# Patient Record
Sex: Male | Born: 1980 | Race: White | Hispanic: No | Marital: Married | Smoking: Light tobacco smoker
Health system: Southern US, Community
[De-identification: ages and names within clinical notes are randomized; demographics above are authoritative.]

## PROBLEM LIST (undated history)

## (undated) DIAGNOSIS — M2550 Pain in unspecified joint: Secondary | ICD-10-CM

## (undated) DIAGNOSIS — M549 Dorsalgia, unspecified: Secondary | ICD-10-CM

## (undated) DIAGNOSIS — D239 Other benign neoplasm of skin, unspecified: Secondary | ICD-10-CM

## (undated) DIAGNOSIS — G4730 Sleep apnea, unspecified: Secondary | ICD-10-CM

## (undated) HISTORY — DX: Other benign neoplasm of skin, unspecified: D23.9

## (undated) HISTORY — DX: Sleep apnea, unspecified: G47.30

## (undated) HISTORY — DX: Pain in unspecified joint: M25.50

## (undated) HISTORY — DX: Dorsalgia, unspecified: M54.9

---
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
## 2003-11-13 ENCOUNTER — Emergency Department (HOSPITAL_COMMUNITY): Admission: EM | Admit: 2003-11-13 | Discharge: 2003-11-14 | Payer: Self-pay | Admitting: Emergency Medicine

## 2003-11-17 ENCOUNTER — Ambulatory Visit (HOSPITAL_COMMUNITY): Admission: RE | Admit: 2003-11-17 | Discharge: 2003-11-17 | Payer: Self-pay | Admitting: Family Medicine

## 2014-04-30 ENCOUNTER — Other Ambulatory Visit: Payer: Self-pay | Admitting: Family Medicine

## 2014-04-30 ENCOUNTER — Ambulatory Visit
Admission: RE | Admit: 2014-04-30 | Discharge: 2014-04-30 | Disposition: A | Discharge status: Home / Self Care | Payer: 59 | Source: Ambulatory Visit | Attending: Family Medicine | Admitting: Family Medicine

## 2014-04-30 DIAGNOSIS — J189 Pneumonia, unspecified organism: Secondary | ICD-10-CM

## 2016-01-04 ENCOUNTER — Other Ambulatory Visit: Payer: Self-pay | Admitting: Family Medicine

## 2016-01-04 DIAGNOSIS — N5089 Other specified disorders of the male genital organs: Secondary | ICD-10-CM

## 2016-01-11 ENCOUNTER — Ambulatory Visit
Admission: RE | Admit: 2016-01-11 | Discharge: 2016-01-11 | Disposition: A | Discharge status: Home / Self Care | Payer: Self-pay | Source: Ambulatory Visit | Attending: Family Medicine | Admitting: Family Medicine

## 2016-01-11 ENCOUNTER — Ambulatory Visit
Admission: RE | Admit: 2016-01-11 | Discharge: 2016-01-11 | Disposition: A | Discharge status: Home / Self Care | Payer: BLUE CROSS/BLUE SHIELD | Source: Ambulatory Visit | Attending: Family Medicine | Admitting: Family Medicine

## 2016-01-11 DIAGNOSIS — N5089 Other specified disorders of the male genital organs: Secondary | ICD-10-CM

## 2018-04-19 DIAGNOSIS — Z0000 Encounter for general adult medical examination without abnormal findings: Secondary | ICD-10-CM | POA: Diagnosis not present

## 2018-04-19 DIAGNOSIS — Z13220 Encounter for screening for lipoid disorders: Secondary | ICD-10-CM | POA: Diagnosis not present

## 2018-04-19 DIAGNOSIS — Z23 Encounter for immunization: Secondary | ICD-10-CM | POA: Diagnosis not present

## 2018-04-26 ENCOUNTER — Ambulatory Visit (INDEPENDENT_AMBULATORY_CARE_PROVIDER_SITE_OTHER): Payer: BLUE CROSS/BLUE SHIELD

## 2018-04-26 ENCOUNTER — Ambulatory Visit: Payer: BLUE CROSS/BLUE SHIELD | Admitting: Podiatry

## 2018-04-26 ENCOUNTER — Encounter: Payer: Self-pay | Admitting: Podiatry

## 2018-04-26 DIAGNOSIS — M722 Plantar fascial fibromatosis: Secondary | ICD-10-CM

## 2018-04-26 MED ORDER — MELOXICAM 15 MG PO TABS: 15.0000 mg | ORAL_TABLET | Freq: Every day | ORAL | 2 refills | Status: AC

## 2018-04-26 NOTE — Patient Instructions (Signed)

## 2018-04-26 NOTE — Progress Notes (Signed)
   Subjective:    Patient ID: Stephen Jenkins, male    DOB: 01-05-1981, 37 y.o.   MRN: [id number]  HPI Old male presents the office today for concerns of bilateral heel pain which is been on for the last 2 years.  He gets pain when he first gets up and gets better throughout the day.  He has tried over-the-counter inserts without any significant improvement.  He denies any swelling, numbness or tingling.  The pain does not wake him up at night.  Described a throbbing sensation.  He has no other concerns today.   Review of Systems  All other systems reviewed and are negative.  History reviewed. No pertinent past medical history.  History reviewed. No pertinent surgical history.   Current Outpatient Medications:  .  meloxicam (MOBIC) 15 MG tablet, Take 1 tablet (15 mg total) by mouth daily., Disp: 30 tablet, Rfl: 2  No Known Allergies      Objective:   Physical Exam  General: AAO x3, NAD  Dermatological: Skin is warm, dry and supple bilateral. Nails x 10 are well manicured; remaining integument appears unremarkable at this time. There are no open sores, no preulcerative lesions, no rash or signs of infection present.  Vascular: Dorsalis Pedis artery and Posterior Tibial artery pedal pulses are 2/4 bilateral with immedate capillary fill time. Pedal hair growth present. No varicosities and no lower extremity edema present bilateral. There is no pain with calf compression, swelling, warmth, erythema.   Neruologic: Grossly intact via light touch bilateral. Protective threshold with Semmes Wienstein monofilament intact to all pedal sites bilateral.  Negative Tinel sign.  Musculoskeletal: There is mild tenderness to palpation along the plantar medial tubercle of the calcaneus at the insertion of plantar fascia on the left and right foot. There is no pain along the course of the plantar fascia within the arch of the foot. Plantar fascia appears to be intact. There is no pain with lateral  compression of the calcaneus or pain with vibratory sensation. There is no pain along the course or insertion of the achilles tendon. No other areas of tenderness to bilateral lower extremities. Muscular strength 5/5 in all groups tested bilateral.  Gait: Unassisted, Nonantalgic.      Assessment & Plan:  Bilateral chronic plantar fasciitis -Treatment options discussed including all alternatives, risks, and complications -Etiology of symptoms were discussed -X-rays were obtained and reviewed with the patient.  No definitive evidence of acute fracture or stress fracture identified today. -Offered steroid injections today. Prescribed mobic. Discussed side effects of the medication and directed to stop if any are to occur and call the office.  -Plantar fascial braces b/l.  -Discussed stretching, icing exercises daily.  Ultimately discussed shoe modifications and orthotics.  When check orthotic coverage for him he can follow-up with Liliane Channel. -Symptoms continue discussed other treatment options including EPAT, PT.   Trula Slade DPM

## 2018-05-01 DIAGNOSIS — H90A32 Mixed conductive and sensorineural hearing loss, unilateral, left ear with restricted hearing on the contralateral side: Secondary | ICD-10-CM | POA: Diagnosis not present

## 2018-05-01 DIAGNOSIS — H90A21 Sensorineural hearing loss, unilateral, right ear, with restricted hearing on the contralateral side: Secondary | ICD-10-CM | POA: Diagnosis not present

## 2018-05-03 IMAGING — US US SCROTUM
1 series · 14 of 25 positions shown · non-contrast
Comparison: None.

CLINICAL DATA: Left testicular mass on recent physical examination.

EXAM:
SCROTAL ULTRASOUND
DOPPLER ULTRASOUND OF THE TESTICLES
TECHNIQUE: Complete ultrasound examination of the testicles, epididymis, and
other scrotal structures was performed. Color and spectral Doppler
ultrasound were also utilized to evaluate blood flow to the
testicles.

[Series 1: us scrotum · 0.07mm/px · 14 of 52 slices shown]
[im 1/52]
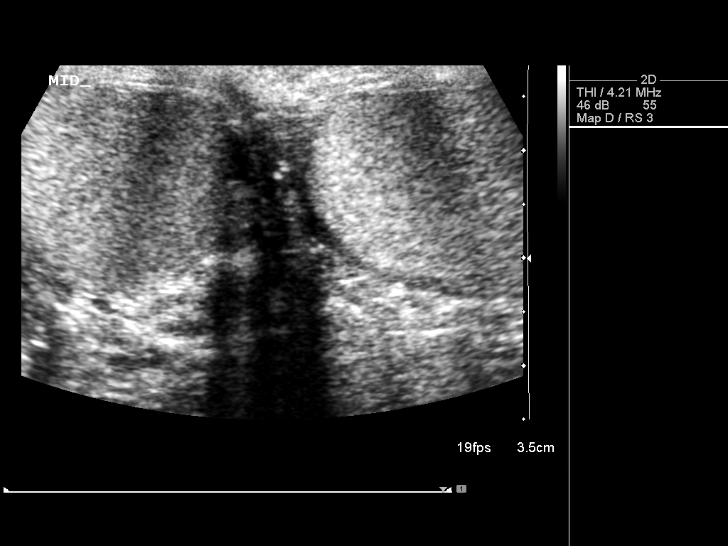
[im 5/52]
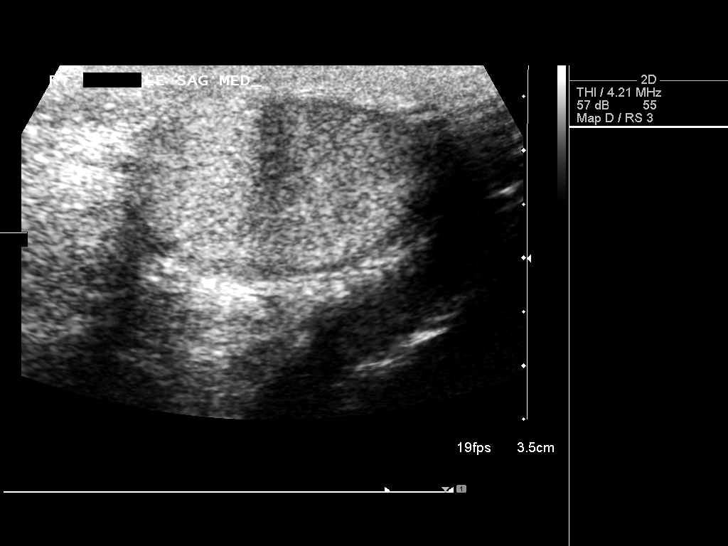
[im 9/52]
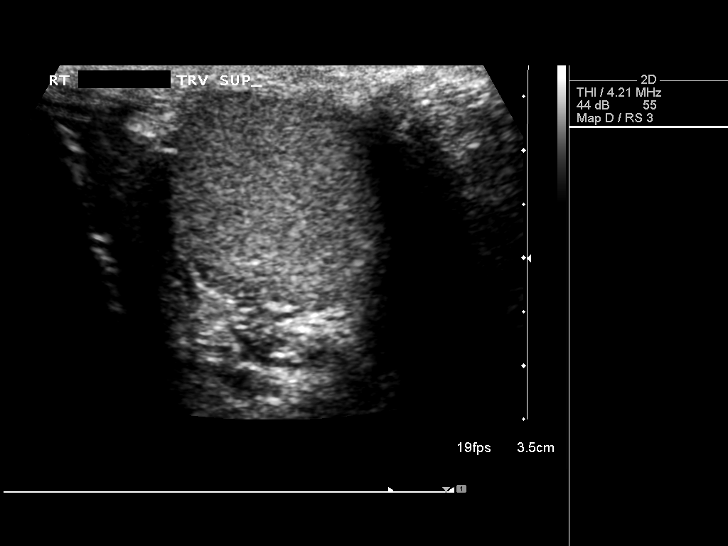
[im 13/52]
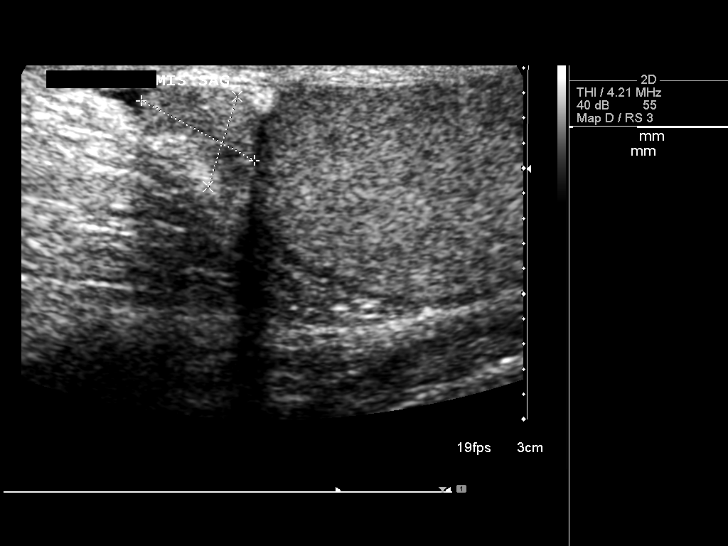
[im 18/52]
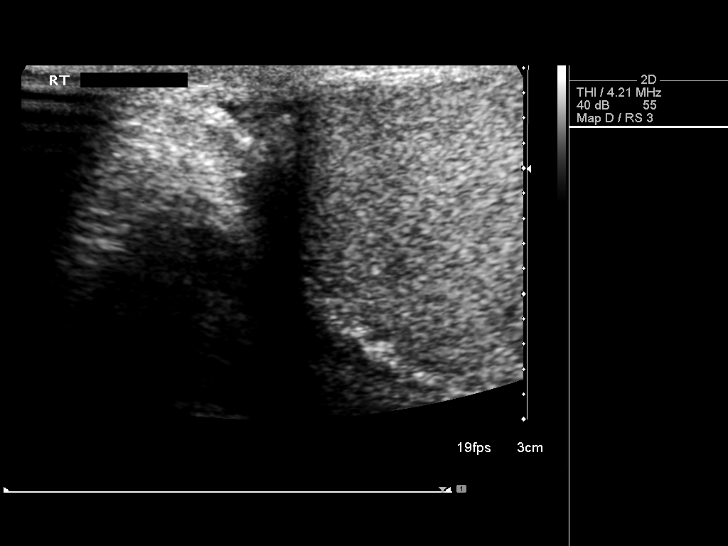
[im 20/52]
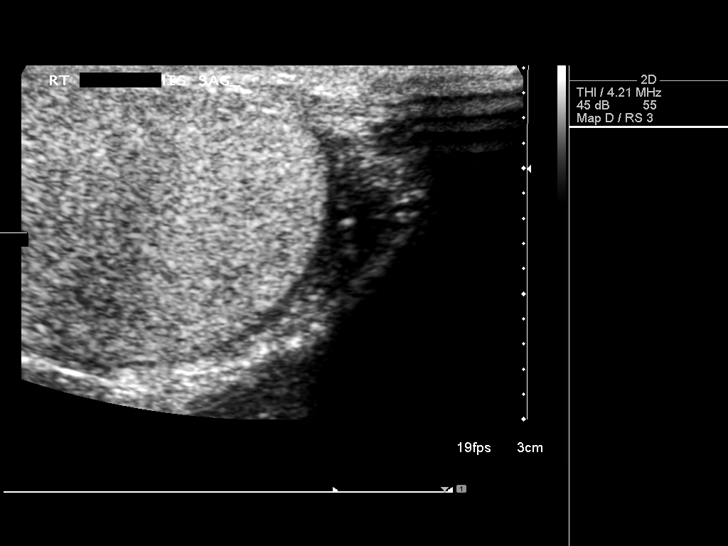
[im 24/52]
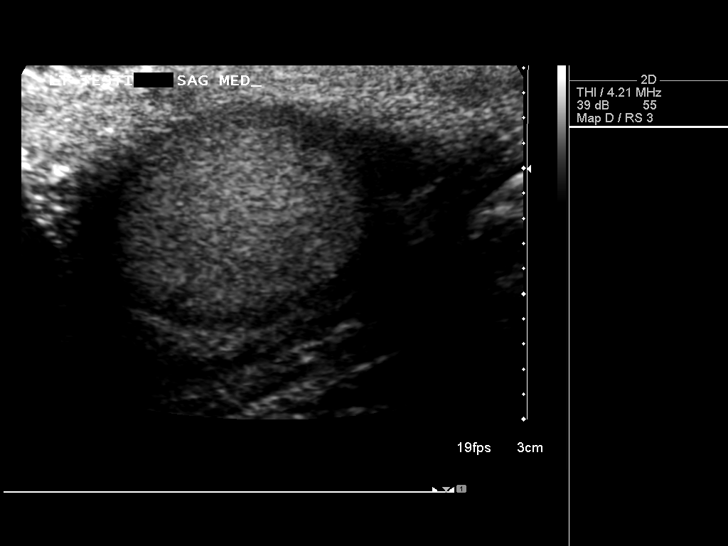
[im 28/52]
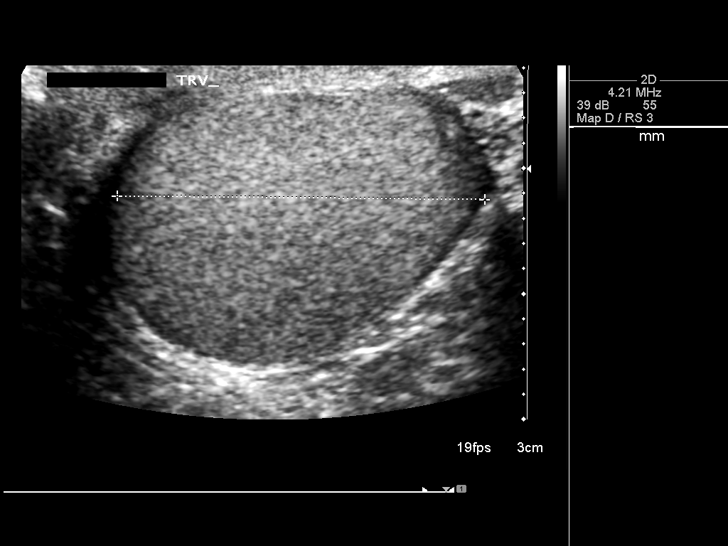
[im 32/52]
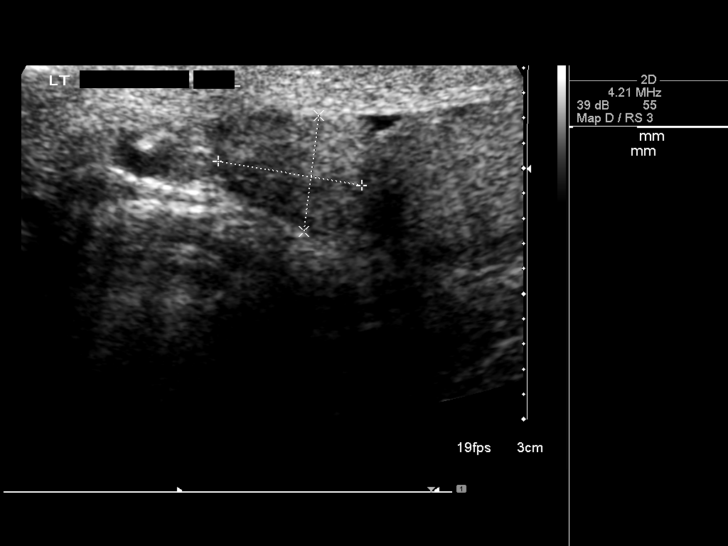
[im 35/52]
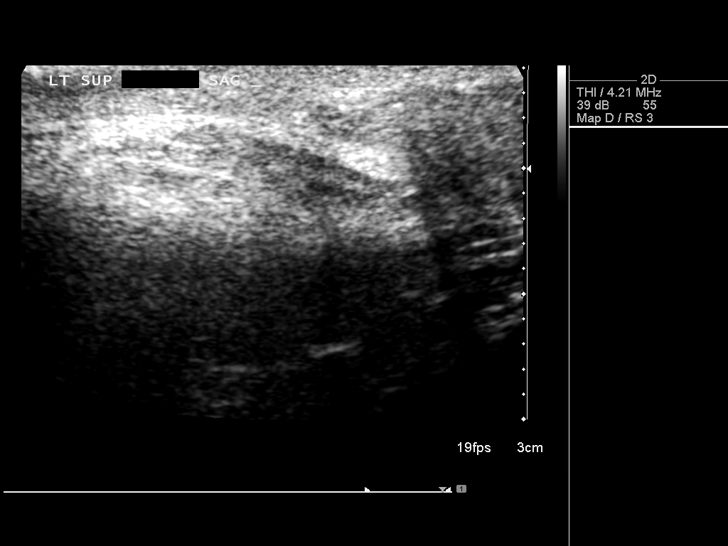
[im 39/52]
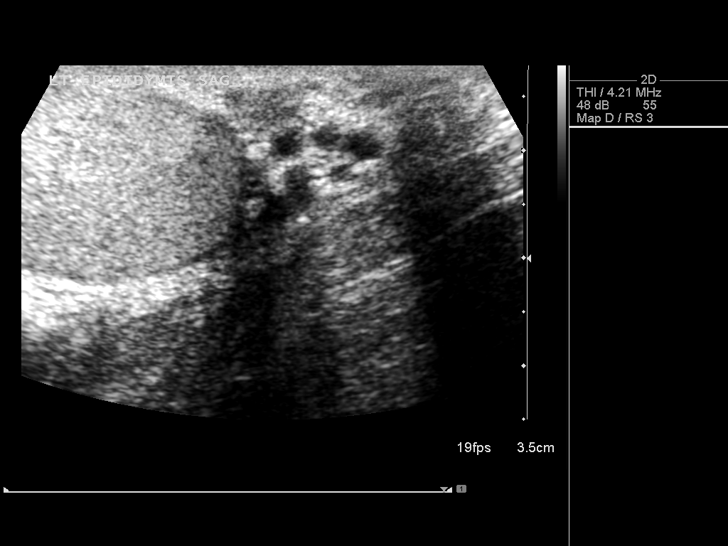
[im 43/52]
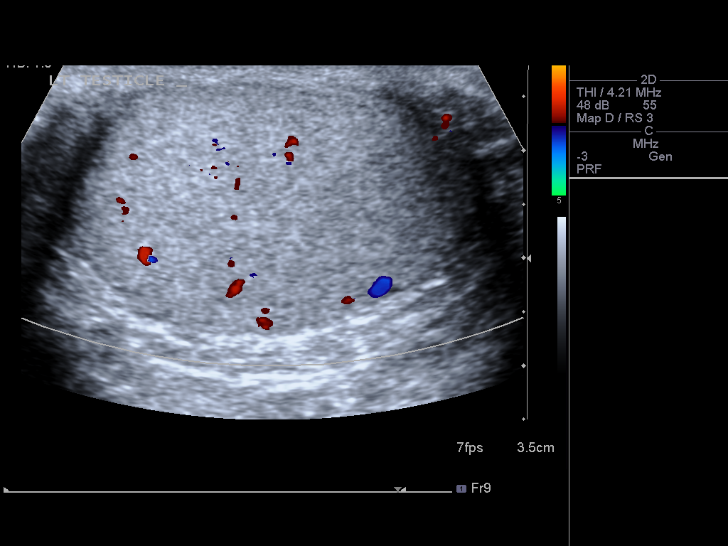
[im 47/52]
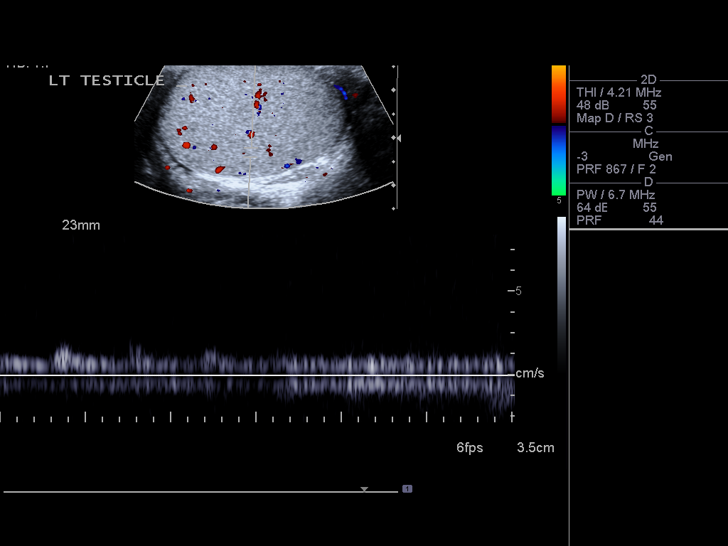
[im 52/52]
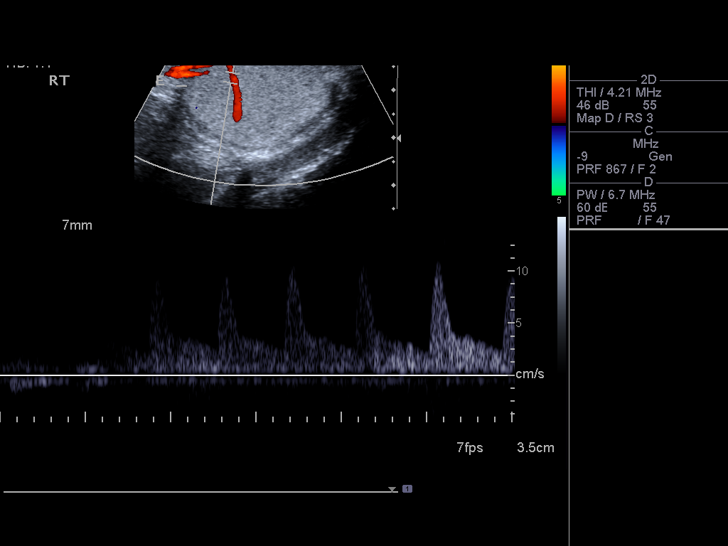

[14 of 25 positions shown; findings below may reference images not displayed]

FINDINGS: Right testicle

Measurements: 4.1 x 2.6 x 2.4 cm. No mass or microlithiasis
visualized.

Left testicle

Measurements: 3.8 x 2.9 x 2.2 cm. No mass or microlithiasis
visualized.

Right epididymis:  Normal in size and appearance.

Left epididymis:  Normal in size and appearance.

Hydrocele:  None visualized.

Varicocele:  None visualized.

Pulsed Doppler interrogation of both testes demonstrates normal low
resistance arterial and venous waveforms bilaterally.
IMPRESSION: Normal examination.  No mass seen.

## 2019-06-18 DIAGNOSIS — F320 Major depressive disorder, single episode, mild: Secondary | ICD-10-CM | POA: Diagnosis not present

## 2019-06-18 DIAGNOSIS — R079 Chest pain, unspecified: Secondary | ICD-10-CM | POA: Diagnosis not present

## 2019-06-18 DIAGNOSIS — Z0000 Encounter for general adult medical examination without abnormal findings: Secondary | ICD-10-CM | POA: Diagnosis not present

## 2019-06-18 DIAGNOSIS — D239 Other benign neoplasm of skin, unspecified: Secondary | ICD-10-CM | POA: Diagnosis not present

## 2019-06-18 DIAGNOSIS — Z13220 Encounter for screening for lipoid disorders: Secondary | ICD-10-CM | POA: Diagnosis not present

## 2019-06-19 ENCOUNTER — Ambulatory Visit: Payer: BC Managed Care – PPO | Admitting: Cardiology

## 2019-06-19 ENCOUNTER — Encounter: Payer: Self-pay | Admitting: Cardiology

## 2019-06-19 ENCOUNTER — Other Ambulatory Visit: Payer: Self-pay

## 2019-06-19 VITALS — BP 113/80 | HR 64 | Temp 97.2°F | Ht 68.0 in | Wt 260.0 lb

## 2019-06-19 DIAGNOSIS — Z7189 Other specified counseling: Secondary | ICD-10-CM | POA: Diagnosis not present

## 2019-06-19 DIAGNOSIS — R072 Precordial pain: Secondary | ICD-10-CM | POA: Insufficient documentation

## 2019-06-19 DIAGNOSIS — Z8249 Family history of ischemic heart disease and other diseases of the circulatory system: Secondary | ICD-10-CM | POA: Diagnosis not present

## 2019-06-19 DIAGNOSIS — Z716 Tobacco abuse counseling: Secondary | ICD-10-CM

## 2019-06-19 DIAGNOSIS — Z823 Family history of stroke: Secondary | ICD-10-CM

## 2019-06-19 DIAGNOSIS — Z6839 Body mass index (BMI) 39.0-39.9, adult: Secondary | ICD-10-CM

## 2019-06-19 NOTE — Patient Instructions (Signed)
Medication Instructions:  Your Physician recommend you continue on your current medication as directed.    *If you need a refill on your cardiac medications before your next appointment, please call your pharmacy*  Lab Work: None  Testing/Procedures: None  Follow-Up: At [facility], you and your health needs are our priority.  As part of our continuing mission to provide you with exceptional heart care, we have created designated Provider Care Teams.  These Care Teams include your primary Cardiologist (physician) and Advanced Practice Providers (APPs -  Physician Assistants and Nurse Practitioners) who all work together to provide you with the care you need, when you need it.  Your next appointment:   As needed  The format for your next appointment:   Either In Person or Virtual  Provider:   Buford Dresser, MD

## 2019-06-19 NOTE — Progress Notes (Signed)
Cardiology Office Note:    Date:  06/19/2019   ID:  AVITAJ SCHOLLER, DOB 07-12-1980, MRN [id number]  PCP:  Caren Macadam, MD  Cardiologist:  Buford Dresser, MD  Referring MD: Caren Macadam, MD   CC: new patient consultation for the evaluation and management of chest pain  History of Present Illness:    Stephen Jenkins is a 39 y.o. male with a family history of heart disease who is seen as a new consult at the request of Caren Macadam, MD for the evaluation and management of chest pain.  Notes received and reviewed from Dr. Roma Kayser office, date of office visit 06/18/19. At that time, he reported chest pain of many years duration, precipitated by exercise/exertion. Sharp, left anterior chest, lasts 15-30 seconds. Recently worsened. Goes away with rest.  Chest pain: -Initial onset: 10-15 years, unclear what started it, initial event was while relaxing. Very fleeting, tolerable initially, felt like "a muscle twinge but a little different, felt deeper". Would come off and on, sometimes with exercise, sometimes with changing position. More recently has been more intense and more frequent. -Quality: sharp, like a knife. Always in the same place, never radiates. -Frequency: at least once a day currently -Duration: 10-15 seconds -Associated symptoms: no other symptoms -Aggravating/alleviating factors: none that he can tell -Prior cardiac history: none that he knows of -Prior workup: none -Prior treatment: none -Alcohol: less than 2/day. Sometimes goes 6 mos without drinking, sometime will drink a case in a day. -Tobacco: rare, about 2 cigars/year. Does not feel he is a smoker. -Comorbidities: none that he knows of. BMI 39. -Exercise level: less intentional walking/exercise now, has been more sedentary with Covid. Can climb stairs, etc. -Cardiac ROS: no shortness of breath, no PND, no orthopnea, no LE edema, no syncope -Family history: PGF, had heart issues and stroke. Father had MI  last year, 95% blockage on main artery, had stent but had stroke as complication (age 19), has afib. Mother's side were all smokers, had issues with lungs.  Recently ordered sertraline, has not started yet.  Past Medical History:  Diagnosis Date  . Dysplastic nevi     History reviewed. No pertinent surgical history.  Current Medications: No current outpatient medications on file prior to visit.   No current facility-administered medications on file prior to visit.     Allergies:   Patient has no known allergies.   Social History   Tobacco Use  . Smoking status: Light Tobacco Smoker    Types: Cigars  . Smokeless tobacco: Never Used  Substance Use Topics  . Alcohol use: Yes  . Drug use: Not on file    Family History: family history includes CAD in his father; Heart disease in his paternal grandfather; Stroke in his father and paternal grandfather.  ROS:   Please see the history of present illness.  Additional pertinent ROS: Constitutional: Negative for chills, fever, night sweats, unintentional weight loss  HENT: Negative for ear pain and hearing loss.   Eyes: Negative for loss of vision and eye pain.  Respiratory: Negative for cough, sputum, wheezing.   Cardiovascular: See HPI. Gastrointestinal: Negative for abdominal pain, melena, and hematochezia.  Genitourinary: Negative for dysuria and hematuria.  Musculoskeletal: Negative for falls and myalgias.  Skin: Negative for itching and rash.  Neurological: Negative for focal weakness, focal sensory changes and loss of consciousness.  Endo/Heme/Allergies: Does not bruise/bleed easily.     EKGs/Labs/Other Studies Reviewed:    The following studies were reviewed today: No prior  cardiac studies  EKG:  EKG is personally reviewed.  The ekg ordered today demonstrates NSR  Recent Labs: No results found for requested labs within last 8760 hours.  Recent Lipid Panel No results found for: CHOL, TRIG, HDL, CHOLHDL, VLDL,  LDLCALC, LDLDIRECT  Physical Exam:    VS:  BP 113/80   Pulse 64   Temp (!) 97.2 F (36.2 C)   Ht 5\' 8"  (1.727 m)   Wt 260 lb (117.9 kg)   SpO2 98%   BMI 39.53 kg/m     Wt Readings from Last 3 Encounters:  06/19/19 260 lb (117.9 kg)    GEN: Well nourished, well developed in no acute distress HEENT: Normal, moist mucous membranes NECK: No JVD CARDIAC: regular rhythm, normal S1 and S2, no rubs or gallops. No murmurs. VASCULAR: Radial and DP pulses 2+ bilaterally. No carotid bruits RESPIRATORY:  Clear to auscultation without rales, wheezing or rhonchi  ABDOMEN: Soft, non-tender, non-distended MUSCULOSKELETAL:  Ambulates independently SKIN: Warm and dry, no edema NEUROLOGIC:  Alert and oriented x 3. No focal neuro deficits noted. PSYCHIATRIC:  Normal affect    ASSESSMENT:    1. Precordial pain   2. Family history of heart disease   3. Cardiac risk counseling   4. Counseling on health promotion and disease prevention   5. Tobacco abuse counseling    PLAN:    Chest pain: Symptoms are atypical in terms of quality, onset, location. We discussed cardiovascular risk at length today extensively, see below. He is low risk based on available information. -We spent significant time today reviewing different parts of the cardiovascular system (electrical, vascular, functional, and valvular). We discussed how each of these systems can present with different symptoms. We reviewed that there are different ways we evaluate these symptoms with tests. We reviewed which tests I think are most appropriate given the symptoms, and we discussed risks/benefits and limitations of each of these tests. Please see summary below. We also discussed that if testing is unrevealing for a cardiac cause of the symptoms, there are many noncardiac causes as well that can contribute to symptoms. If the heart is ruled out, then I recommend returning to PCP to discuss alternative diagnoses. -discussed treadmill stress,  nuclear stress/lexiscan, and CT coronary angiography. Discussed pros and cons of each, including but not limited to false positive/false negative risk, radiation risk, and risk of IV contrast dye. Based on shared decision making, decision was made to monitor symptoms and forgo additional testing at this time -counseled on red flag warning signs that need immediate medical attention  Tobacco abuse, tobacco cessation counseling: Rare, but cigar use counts. Recommend complete cessation. The patient was counseled on tobacco cessation today for 4 minutes.  Counseling included reviewing the risks of smoking tobacco products, how it impacts the patient's current medical diagnoses and different strategies for quitting.  Pharmacotherapy to aid in tobacco cessation was not prescribed today.  Family history of heart disease: father had MI in his mid-59s, which is not technically premature CAD but does count as family history. Discussed good lifestyle habits for prevention.  Cardiac risk counseling and prevention recommendations: -recommend heart healthy/Mediterranean diet, with whole grains, fruits, vegetable, fish, lean meats, nuts, and olive oil. Limit salt. -recommend moderate walking, 3-5 times/week for 30-50 minutes each session. Aim for at least 150 minutes.week. Goal should be pace of 3 miles/hours, or walking 1.5 miles in 30 minutes -recommend avoidance of tobacco products. Avoid excess alcohol. -Additional risk factor control:  -Diabetes risk: A1c  is not available  -Lipids: 06/18/19 Tchol 169, HDL 43, LDL 108, TG 91  -Blood pressure control: at goal, no history  -Weight: BMI 39, but athletic build. -ASCVD risk score: The ASCVD Risk score Mikey Bussing DC Jr., et al., 2013) failed to calculate for the following reasons:   The 2013 ASCVD risk score is only valid for ages 86 to 75   Assuming age 28, ASCVD 2.6%  Plan for follow up: he was encouraged to follow up as needed with any future concerns  Total time  of encounter: 45 minutes total time of encounter, including 33 minutes spent in face-to-face patient care. This time includes coordination of care and counseling regarding CV risk, chest pain, counseling. Remainder of non-face-to-face time involved reviewing chart documents/testing relevant to the patient encounter and documentation in the medical record.  Buford Dresser, MD, PhD [place]  [facility]    Medication Adjustments/Labs and Tests Ordered: Current medicines are reviewed at length with the patient today.  Concerns regarding medicines are outlined above.  Orders Placed This Encounter  Procedures  . EKG 12-Lead   No orders of the defined types were placed in this encounter.   Patient Instructions  Medication Instructions:  Your Physician recommend you continue on your current medication as directed.    *If you need a refill on your cardiac medications before your next appointment, please call your pharmacy*  Lab Work: None  Testing/Procedures: None  Follow-Up: At [facility], you and your health needs are our priority.  As part of our continuing mission to provide you with exceptional heart care, we have created designated Provider Care Teams.  These Care Teams include your primary Cardiologist (physician) and Advanced Practice Providers (APPs -  Physician Assistants and Nurse Practitioners) who all work together to provide you with the care you need, when you need it.  Your next appointment:   As needed  The format for your next appointment:   Either In Person or Virtual  Provider:   Buford Dresser, MD      Signed, Buford Dresser, MD PhD 06/19/2019  [place]

## 2019-07-19 DIAGNOSIS — R0683 Snoring: Secondary | ICD-10-CM | POA: Diagnosis not present

## 2019-07-19 DIAGNOSIS — Z6839 Body mass index (BMI) 39.0-39.9, adult: Secondary | ICD-10-CM | POA: Diagnosis not present

## 2019-07-19 DIAGNOSIS — R5383 Other fatigue: Secondary | ICD-10-CM | POA: Diagnosis not present

## 2019-07-19 DIAGNOSIS — F320 Major depressive disorder, single episode, mild: Secondary | ICD-10-CM | POA: Diagnosis not present

## 2019-07-19 DIAGNOSIS — R400 Somnolence: Secondary | ICD-10-CM | POA: Diagnosis not present

## 2019-08-09 DIAGNOSIS — Z86018 Personal history of other benign neoplasm: Secondary | ICD-10-CM | POA: Diagnosis not present

## 2019-08-09 DIAGNOSIS — L814 Other melanin hyperpigmentation: Secondary | ICD-10-CM | POA: Diagnosis not present

## 2019-08-09 DIAGNOSIS — D1801 Hemangioma of skin and subcutaneous tissue: Secondary | ICD-10-CM | POA: Diagnosis not present

## 2019-08-09 DIAGNOSIS — D225 Melanocytic nevi of trunk: Secondary | ICD-10-CM | POA: Diagnosis not present

## 2019-08-09 DIAGNOSIS — L905 Scar conditions and fibrosis of skin: Secondary | ICD-10-CM | POA: Diagnosis not present

## 2019-08-09 DIAGNOSIS — L578 Other skin changes due to chronic exposure to nonionizing radiation: Secondary | ICD-10-CM | POA: Diagnosis not present

## 2019-08-09 DIAGNOSIS — D485 Neoplasm of uncertain behavior of skin: Secondary | ICD-10-CM | POA: Diagnosis not present

## 2019-08-14 DIAGNOSIS — R0683 Snoring: Secondary | ICD-10-CM | POA: Diagnosis not present

## 2019-08-14 DIAGNOSIS — R0681 Apnea, not elsewhere classified: Secondary | ICD-10-CM | POA: Diagnosis not present

## 2019-08-14 DIAGNOSIS — G4700 Insomnia, unspecified: Secondary | ICD-10-CM | POA: Diagnosis not present

## 2019-08-14 DIAGNOSIS — G478 Other sleep disorders: Secondary | ICD-10-CM | POA: Diagnosis not present

## 2019-08-28 DIAGNOSIS — G4733 Obstructive sleep apnea (adult) (pediatric): Secondary | ICD-10-CM | POA: Diagnosis not present

## 2019-08-29 DIAGNOSIS — G4733 Obstructive sleep apnea (adult) (pediatric): Secondary | ICD-10-CM | POA: Diagnosis not present

## 2019-09-16 DIAGNOSIS — F320 Major depressive disorder, single episode, mild: Secondary | ICD-10-CM | POA: Diagnosis not present

## 2019-09-16 DIAGNOSIS — E669 Obesity, unspecified: Secondary | ICD-10-CM | POA: Diagnosis not present

## 2020-01-28 DIAGNOSIS — D225 Melanocytic nevi of trunk: Secondary | ICD-10-CM | POA: Diagnosis not present

## 2020-01-28 DIAGNOSIS — L905 Scar conditions and fibrosis of skin: Secondary | ICD-10-CM | POA: Diagnosis not present

## 2020-01-28 DIAGNOSIS — D485 Neoplasm of uncertain behavior of skin: Secondary | ICD-10-CM | POA: Diagnosis not present

## 2020-01-28 DIAGNOSIS — D2262 Melanocytic nevi of left upper limb, including shoulder: Secondary | ICD-10-CM | POA: Diagnosis not present

## 2020-03-17 DIAGNOSIS — R61 Generalized hyperhidrosis: Secondary | ICD-10-CM | POA: Diagnosis not present

## 2020-03-17 DIAGNOSIS — F320 Major depressive disorder, single episode, mild: Secondary | ICD-10-CM | POA: Diagnosis not present

## 2020-04-24 DIAGNOSIS — Z20822 Contact with and (suspected) exposure to covid-19: Secondary | ICD-10-CM | POA: Diagnosis not present

## 2020-04-24 DIAGNOSIS — Z03818 Encounter for observation for suspected exposure to other biological agents ruled out: Secondary | ICD-10-CM | POA: Diagnosis not present

## 2020-06-25 DIAGNOSIS — G4733 Obstructive sleep apnea (adult) (pediatric): Secondary | ICD-10-CM | POA: Diagnosis not present

## 2020-06-25 DIAGNOSIS — Z0000 Encounter for general adult medical examination without abnormal findings: Secondary | ICD-10-CM | POA: Diagnosis not present

## 2020-06-25 DIAGNOSIS — Z13220 Encounter for screening for lipoid disorders: Secondary | ICD-10-CM | POA: Diagnosis not present

## 2020-06-25 DIAGNOSIS — R251 Tremor, unspecified: Secondary | ICD-10-CM | POA: Diagnosis not present

## 2020-06-25 DIAGNOSIS — F320 Major depressive disorder, single episode, mild: Secondary | ICD-10-CM | POA: Diagnosis not present

## 2020-06-25 DIAGNOSIS — R030 Elevated blood-pressure reading, without diagnosis of hypertension: Secondary | ICD-10-CM | POA: Diagnosis not present

## 2020-06-26 ENCOUNTER — Encounter: Payer: Self-pay | Admitting: Neurology

## 2020-06-30 NOTE — Progress Notes (Unsigned)
Assessment/Plan:   1.  Essential Tremor.  -This is evidenced by the symmetrical nature and longstanding hx of gradually getting worse.  We discussed nature and pathophysiology.  We discussed that this can continue to gradually get worse with time.  We discussed that some medications can worsen this, as can caffeine use.  We discussed medication therapy as well as surgical therapy.  Ultimately, the patient decided to hold off on any medications.  He really just wanted to know that this was not something more progressive or detrimental to his health.  He will certainly let me know if tremor is more bothersome or requires medication/intervention.  He will follow up with me on an as-needed basis.   Subjective:   Stephen Jenkins was seen today in the movement disorders clinic for neurologic consultation at the request of Caren Macadam, MD.  The consultation is for the evaluation of tremor.  Tremor: Yes.     How long has it been going on? few years per patient, worse over last 2 months  At rest or with activation?  activation  When is it noted the most?  Doing physical things with the R hand, esp tedious activities  Fam hx of tremor?  Yes.  maternal aunt w tremor and great paternal uncle with tremor  Located where?  R hand>L hand.  No tremor in the legs  Affected by caffeine:  No. (drinks 24-36 oz of coffee/day)  Affected by alcohol:  No. (none in 3 months but then may binge)  Affected by stress:  Yes.    Affected by fatigue:  No.  Spills soup if on spoon:  No. (he is R hand dominant)  Spills glass of liquid if full:  No.  Affects ADL's (tying shoes, brushing teeth, etc):  No.  Tremor inducing meds:  No.  Other Specific Symptoms:  Voice: no change Sleep: sleeps well  Vivid Dreams:  No.  Acting out dreams:  No. Wet Pillows: No. Postural symptoms:  Mild trouble and attributes to meds  Falls?  No. Bradykinesia symptoms: no bradykinesia noted Loss of smell:  No. Loss of taste:   No. Urinary Incontinence:  No. Difficulty Swallowing:  No. Handwriting, micrographia: No. Trouble with ADL's:  No.  Trouble buttoning clothing: No. Depression:  Good with sertraline N/V:  No. Lightheaded:  No.  Syncope: No. Diplopia:  No. but some blurry vision Dyskinesia:  No.  Neuroimaging of the brain has not previously been performed.    ALLERGIES:  No Known Allergies  CURRENT MEDICATIONS:  Current Outpatient Medications  Medication Instructions  . sertraline (ZOLOFT) 50 MG tablet 1 tablet, Oral, Daily    Objective:   PHYSICAL EXAMINATION:    VITALS:   Vitals:   07/02/20 0945  BP: 120/88  Pulse: 68  SpO2: 98%  Weight: 242 lb (109.8 kg)  Height: 5\' 9"  (1.753 m)    GEN:  The patient appears stated age and is in NAD. HEENT:  Normocephalic, atraumatic.  The mucous membranes are moist. The superficial temporal arteries are without ropiness or tenderness. CV:  RRR Lungs:  CTAB Neck/HEME:  There are no carotid bruits bilaterally.  Neurological examination:  Orientation: The patient is alert and oriented x3.  Cranial nerves: There is good facial symmetry.  Extraocular muscles are intact. The visual fields are full to confrontational testing. The speech is fluent and clear. Soft palate rises symmetrically and there is no tongue deviation. Hearing is intact to conversational tone. Sensation: Sensation is intact to light touch  throughout (facial, trunk, extremities). Vibration is intact at the bilateral big toe. There is no extinction with double simultaneous stimulation.  Motor: Strength is 5/5 in the bilateral upper and lower extremities.   Shoulder shrug is equal and symmetric.  There is no pronator drift. Deep tendon reflexes: Deep tendon reflexes are 2/4 at the bilateral biceps, triceps, brachioradialis, patella and achilles. Plantar responses are downgoing bilaterally.  Movement examination: Tone: There is normal tone in the bilateral upper extremities.  The tone  in the lower extremities is normal.  Abnormal movements: No rest tremor.  Very little postural tremor.  Mild intention tremor, slightly worse with the weight.  Slightly worse on the right side than the left.  Able to draw Archimedes spirals without significant tremor.  Able to pour water from 1 glass to another. Coordination:  There is no decremation with RAM's, with any form of RAMS, including alternating supination and pronation of the forearm, hand opening and closing, finger taps, heel taps and toe taps. Gait and Station: The patient has no difficulty arising out of a deep-seated chair without the use of the hands. The patient's stride length is good.   I have reviewed and interpreted the following labs independently  Lab work was done by primary care on March 17, 2020.  TSH was 0.92.  Sodium was 140, potassium 5.1, chloride 104, BUN 17, creatinine 0.98, AST 13, ALT 17, white blood cells 5.8, hemoglobin 15.7, hematocrit 46.9 and platelets 231  Total time spent on today's visit was 40 minutes, including both face-to-face time and nonface-to-face time.  Time included that spent on review of records (prior notes available to me/labs/imaging if pertinent), discussing treatment and goals, answering patient's questions and coordinating care.  Cc:  Caren Macadam, MD

## 2020-07-02 ENCOUNTER — Other Ambulatory Visit: Payer: Self-pay

## 2020-07-02 ENCOUNTER — Encounter: Payer: Self-pay | Admitting: Neurology

## 2020-07-02 ENCOUNTER — Ambulatory Visit: Payer: BC Managed Care – PPO | Admitting: Neurology

## 2020-07-02 VITALS — BP 120/88 | HR 68 | Ht 69.0 in | Wt 242.0 lb

## 2020-07-02 DIAGNOSIS — G250 Essential tremor: Secondary | ICD-10-CM

## 2020-07-02 NOTE — Patient Instructions (Signed)

## 2020-08-04 DIAGNOSIS — L814 Other melanin hyperpigmentation: Secondary | ICD-10-CM | POA: Diagnosis not present

## 2020-08-04 DIAGNOSIS — D1801 Hemangioma of skin and subcutaneous tissue: Secondary | ICD-10-CM | POA: Diagnosis not present

## 2020-08-04 DIAGNOSIS — Z86018 Personal history of other benign neoplasm: Secondary | ICD-10-CM | POA: Diagnosis not present

## 2020-08-04 DIAGNOSIS — D225 Melanocytic nevi of trunk: Secondary | ICD-10-CM | POA: Diagnosis not present

## 2021-02-11 DIAGNOSIS — M722 Plantar fascial fibromatosis: Secondary | ICD-10-CM | POA: Diagnosis not present

## 2021-04-09 DIAGNOSIS — M79671 Pain in right foot: Secondary | ICD-10-CM | POA: Diagnosis not present

## 2021-04-09 DIAGNOSIS — M79672 Pain in left foot: Secondary | ICD-10-CM | POA: Diagnosis not present

## 2021-04-09 DIAGNOSIS — M722 Plantar fascial fibromatosis: Secondary | ICD-10-CM | POA: Diagnosis not present

## 2021-04-22 DIAGNOSIS — M79672 Pain in left foot: Secondary | ICD-10-CM | POA: Diagnosis not present

## 2021-04-22 DIAGNOSIS — M722 Plantar fascial fibromatosis: Secondary | ICD-10-CM | POA: Diagnosis not present

## 2021-04-22 DIAGNOSIS — M79671 Pain in right foot: Secondary | ICD-10-CM | POA: Diagnosis not present

## 2021-05-13 DIAGNOSIS — M79672 Pain in left foot: Secondary | ICD-10-CM | POA: Diagnosis not present

## 2021-05-13 DIAGNOSIS — M722 Plantar fascial fibromatosis: Secondary | ICD-10-CM | POA: Diagnosis not present

## 2021-05-13 DIAGNOSIS — M79671 Pain in right foot: Secondary | ICD-10-CM | POA: Diagnosis not present

## 2021-06-04 DIAGNOSIS — J069 Acute upper respiratory infection, unspecified: Secondary | ICD-10-CM | POA: Diagnosis not present

## 2021-06-04 DIAGNOSIS — R509 Fever, unspecified: Secondary | ICD-10-CM | POA: Diagnosis not present

## 2021-06-04 DIAGNOSIS — R051 Acute cough: Secondary | ICD-10-CM | POA: Diagnosis not present

## 2021-06-04 DIAGNOSIS — U071 COVID-19: Secondary | ICD-10-CM | POA: Diagnosis not present

## 2021-06-04 DIAGNOSIS — R059 Cough, unspecified: Secondary | ICD-10-CM | POA: Diagnosis not present

## 2021-06-04 DIAGNOSIS — R519 Headache, unspecified: Secondary | ICD-10-CM | POA: Diagnosis not present

## 2021-07-08 DIAGNOSIS — Z0000 Encounter for general adult medical examination without abnormal findings: Secondary | ICD-10-CM | POA: Diagnosis not present

## 2021-07-08 DIAGNOSIS — Z13220 Encounter for screening for lipoid disorders: Secondary | ICD-10-CM | POA: Diagnosis not present

## 2021-11-29 DIAGNOSIS — R5383 Other fatigue: Secondary | ICD-10-CM | POA: Diagnosis not present

## 2021-11-29 DIAGNOSIS — R0602 Shortness of breath: Secondary | ICD-10-CM | POA: Diagnosis not present

## 2021-11-29 DIAGNOSIS — Z1389 Encounter for screening for other disorder: Secondary | ICD-10-CM | POA: Diagnosis not present

## 2021-11-29 DIAGNOSIS — E669 Obesity, unspecified: Secondary | ICD-10-CM | POA: Diagnosis not present

## 2021-11-29 DIAGNOSIS — G4733 Obstructive sleep apnea (adult) (pediatric): Secondary | ICD-10-CM | POA: Diagnosis not present

## 2021-12-20 DIAGNOSIS — G4733 Obstructive sleep apnea (adult) (pediatric): Secondary | ICD-10-CM | POA: Diagnosis not present

## 2021-12-20 DIAGNOSIS — Z6836 Body mass index (BMI) 36.0-36.9, adult: Secondary | ICD-10-CM | POA: Diagnosis not present

## 2021-12-20 DIAGNOSIS — E669 Obesity, unspecified: Secondary | ICD-10-CM | POA: Diagnosis not present

## 2022-03-09 ENCOUNTER — Encounter (INDEPENDENT_AMBULATORY_CARE_PROVIDER_SITE_OTHER): Payer: BLUE CROSS/BLUE SHIELD | Admitting: Family Medicine

## 2022-03-10 DIAGNOSIS — Z0289 Encounter for other administrative examinations: Secondary | ICD-10-CM

## 2022-03-11 ENCOUNTER — Emergency Department (HOSPITAL_BASED_OUTPATIENT_CLINIC_OR_DEPARTMENT_OTHER): Payer: BC Managed Care – PPO

## 2022-03-11 ENCOUNTER — Emergency Department (HOSPITAL_BASED_OUTPATIENT_CLINIC_OR_DEPARTMENT_OTHER)
Admission: EM | Admit: 2022-03-11 | Discharge: 2022-03-11 | Disposition: A | Discharge status: Home / Self Care | Payer: BC Managed Care – PPO | Attending: Emergency Medicine | Admitting: Emergency Medicine

## 2022-03-11 ENCOUNTER — Encounter (HOSPITAL_BASED_OUTPATIENT_CLINIC_OR_DEPARTMENT_OTHER): Payer: Self-pay | Admitting: *Deleted

## 2022-03-11 ENCOUNTER — Other Ambulatory Visit: Payer: Self-pay

## 2022-03-11 DIAGNOSIS — R519 Headache, unspecified: Secondary | ICD-10-CM | POA: Insufficient documentation

## 2022-03-11 DIAGNOSIS — R42 Dizziness and giddiness: Secondary | ICD-10-CM | POA: Insufficient documentation

## 2022-03-11 DIAGNOSIS — R0789 Other chest pain: Secondary | ICD-10-CM | POA: Diagnosis not present

## 2022-03-11 DIAGNOSIS — R079 Chest pain, unspecified: Secondary | ICD-10-CM

## 2022-03-11 LAB — BASIC METABOLIC PANEL
Anion gap: 10 (ref 5–15)
BUN: 19 mg/dL (ref 6–20)
CO2: 23 mmol/L (ref 22–32)
Calcium: 8.9 mg/dL (ref 8.9–10.3)
Chloride: 106 mmol/L (ref 98–111)
Creatinine, Ser: 1.04 mg/dL (ref 0.61–1.24)
GFR, Estimated: >60 mL/min (ref >60)
Glucose, Bld: 94 mg/dL (ref 70–99)
Potassium: 4.6 mmol/L (ref 3.5–5.1)
Sodium: 139 mmol/L (ref 135–145)

## 2022-03-11 LAB — CBC
HCT: 45.2 % (ref 39.0–52.0)
Hemoglobin: 15.7 g/dL (ref 13.0–17.0)
MCH: 29.1 pg (ref 26.0–34.0)
MCHC: 34.7 g/dL (ref 30.0–36.0)
MCV: 83.9 fL (ref 80.0–100.0)
Platelets: 198 10*3/uL (ref 150–400)
RBC: 5.39 MIL/uL (ref 4.22–5.81)
RDW: 12.6 % (ref 11.5–15.5)
WBC: 5.5 10*3/uL (ref 4.0–10.5)
nRBC: 0 % (ref 0.0–0.2)

## 2022-03-11 LAB — TROPONIN I (HIGH SENSITIVITY): Troponin I (High Sensitivity): <2 ng/L (ref <18)

## 2022-03-11 NOTE — ED Provider Notes (Signed)
[facility] Provider Note   CSN: [id number] Arrival date & time: 03/11/22  0160     History  Chief Complaint  Patient presents with   Chest Pain   Dizziness   Headache    Stephen Jenkins is a 41 y.o. male.  Patient is a 41 year old male with no significant past medical history presenting to the emergency department with chest pain and headache.  Patient states that he developed a chest pressure type pain yesterday afternoon that persisted throughout the night.  He states that he tried taking Tums but had no relief.  He states he did have chicken Parmesan for dinner last night but his pain started prior to eating.  He states that he felt like it was difficult to take a deep breath.  He states that he has also been having frontal headaches on and off for the past month.  He states these are not a daily occurrence but do occur several times a week.  He states he mostly wakes up with a headache but it can come on anytime throughout the day.  He states he has associated blurry vision.  He denies any numbness or weakness, nausea or vomiting.  He denies any recent fevers, lower extremity swelling, cough or sore throat.  His wife does report that he has a history of anxiety and has been off his anxiety meds for the last year so she is unsure if this could be contributing to his symptoms.  Denies any recent hospitalizations or surgery, recent long travels in the car plane, cancer history, hormone use.  The history is provided by the patient and the spouse.  Chest Pain Associated symptoms: dizziness and headache   Dizziness Associated symptoms: chest pain and headaches   Headache Associated symptoms: dizziness        Home Medications Prior to Admission medications   Medication Sig Start Date End Date Taking? Authorizing Provider  sertraline (ZOLOFT) 50 MG tablet Take 1 tablet by mouth daily.    [provider]      Allergies    Patient has no known  allergies.    Review of Systems   Review of Systems  Cardiovascular:  Positive for chest pain.  Neurological:  Positive for dizziness and headaches.    Physical Exam Updated Vital Signs BP (!) 137/92   Pulse 68   Resp 18   Ht '5\' 9"'$  (1.753 m)   Wt 114.8 kg   SpO2 96%   BMI 37.36 kg/m  Physical Exam Vitals and nursing note reviewed.  Constitutional:      General: He is not in acute distress.    Appearance: He is well-developed.  HENT:     Head: Normocephalic and atraumatic.  Eyes:     Extraocular Movements: Extraocular movements intact.     Pupils: Pupils are equal, round, and reactive to light.  Cardiovascular:     Rate and Rhythm: Normal rate and regular rhythm.     Pulses:          Radial pulses are 2+ on the right side and 2+ on the left side.       Dorsalis pedis pulses are 2+ on the right side and 2+ on the left side.     Heart sounds: Normal heart sounds.  Pulmonary:     Effort: Pulmonary effort is normal.     Breath sounds: Normal breath sounds.  Chest:     Chest wall: No tenderness.  Abdominal:  Palpations: Abdomen is soft.     Tenderness: There is no abdominal tenderness.  Musculoskeletal:        General: Normal range of motion.     Cervical back: Normal range of motion and neck supple.     Right lower leg: No edema.     Left lower leg: No edema.  Skin:    General: Skin is warm and dry.  Neurological:     General: No focal deficit present.     Mental Status: He is alert and oriented to person, place, and time.     Cranial Nerves: No cranial nerve deficit.     Comments: Strength 5 out of 5 in all 4 extremities Normal finger-to-nose bilaterally Sensation intact in all 4 extremities  Psychiatric:        Mood and Affect: Mood normal.        Behavior: Behavior normal.     ED Results / Procedures / Treatments   Labs (all labs ordered are listed, but only abnormal results are displayed) Labs Reviewed  BASIC METABOLIC PANEL  CBC  TROPONIN I (HIGH  SENSITIVITY)    EKG EKG Interpretation  Date/Time:  Friday March 11 2022 06:43:09 EDT Ventricular Rate:  68 PR Interval:  168 QRS Duration: 92 QT Interval:  370 QTC Calculation: 394 R Axis:   54 Text Interpretation: Sinus rhythm No previous ECGs available Confirmed by Leanord Asal ([id number]) on 03/11/2022 7:08:03 AM  Radiology CT Head Wo Contrast  Result Date: 03/11/2022 CLINICAL DATA:  Headache EXAM: CT HEAD WITHOUT CONTRAST TECHNIQUE: Contiguous axial images were obtained from the base of the skull through the vertex without intravenous contrast. RADIATION DOSE REDUCTION: This exam was performed according to the departmental dose-optimization program which includes automated exposure control, adjustment of the mA and/or kV according to patient size and/or use of iterative reconstruction technique. COMPARISON:  None Available. FINDINGS: Brain: No evidence of acute infarction, hemorrhage, hydrocephalus, extra-axial collection or mass lesion/mass effect. Vascular: No hyperdense vessel or unexpected calcification. Skull: Normal. Negative for fracture or focal lesion. Sinuses/Orbits: No acute finding. Other: None. IMPRESSION: No etiology for headaches identified. Electronically Signed   By: Marin Roberts M.D.   On: 03/11/2022 07:50   DG Chest Portable 1 View  Result Date: 03/11/2022 CLINICAL DATA:  Chest pain EXAM: PORTABLE CHEST 1 VIEW COMPARISON:  04/30/2014 FINDINGS: Normal heart size and mediastinal contours. No acute infiltrate or edema. No effusion or pneumothorax. No acute osseous findings. IMPRESSION: Negative portable chest. Electronically Signed   By: Jorje Guild M.D.   On: 03/11/2022 07:11    Procedures Procedures    Medications Ordered in ED Medications - No data to display  ED Course/ Medical Decision Making/ A&P Clinical Course as of 03/11/22 0806  Fri Mar 11, 2022  0800 CT head was negative, labs are within normal range including a negative troponin.  His chest  pain has been ongoing for several hours so single troponin is sufficient.  Patient is stable for discharge home with primary care follow-up and will be given strict return precautions. [VK]    Clinical Course User Index [VK] Kemper Durie, DO                           Medical Decision Making This patient presents to the ED with chief complaint(s) of chest pain, headache with no pertinent past medical history which further complicates the presenting complaint. The complaint involves an extensive differential diagnosis and  also carries with it a high risk of complications and morbidity.    The differential diagnosis includes ACS, arrhythmia, anemia, electrolyte abnormality, pneumonia, pneumothorax, PE unlikely as patient is PERC negative, possible mass effect as cause of headache, no focal neurologic deficits and due to the chronicity of the headache, CVA or ICH is unlikely, he has no fevers or other infectious symptoms making viral syndrome less likely  Additional history obtained: Additional history obtained from spouse Records reviewed outpatient cardiology records -no previous stress test or echo  ED Course and Reassessment: Upon patient's arrival to the emergency department he is awake alert and well-appearing, not currently having chest pain.  EKG is nonischemic.  He will have labs, chest x-ray as well as a CT head.  He was offered Tylenol for his headache but declined.  He will be closely monitored.  Independent labs interpretation:  The following labs were independently interpreted: Within normal range  Independent visualization of imaging: - I independently visualized the following imaging with scope of interpretation limited to determining acute life threatening conditions related to emergency care: Chest x-ray, CT head, which revealed no acute disease  Consultation: - Consulted or discussed management/test interpretation w/ external professional: N/A  Consideration for  admission or further workup: Patient has no emergent conditions requiring admission at this time and is stable for discharge with primary care follow-up Social Determinants of health: N/A    Amount and/or Complexity of Data Reviewed Labs: ordered. Radiology: ordered.          Final Clinical Impression(s) / ED Diagnoses Final diagnoses:  Chest pain, unspecified type  Recurrent headache    Rx / DC Orders ED Discharge Orders     None         Kemper Durie, DO 03/11/22 [phone]

## 2022-03-11 NOTE — Discharge Instructions (Signed)
You were seen in the emergency department for your chest pain and your headache.  You had no signs of heart attack, abnormal heart rhythms, your heart appeared to normal size on your x-ray and you had no signs of any fluid on your lungs or signs of infection.  We also performed a CT scan of your brain that appeared normal as well.  It is unclear what is causing your symptoms at this time you should follow-up with your primary doctor to have your symptoms rechecked and to determine if you need further work-up done as an outpatient.  You should return to the emergency department if you are having significantly worsening chest pain, worsening shortness of breath, you pass out, you have repetitive vomiting, you have numbness or weakness on one side of the body compared to the other or if you have any other new or concerning symptoms.

## 2022-03-11 NOTE — ED Notes (Signed)
Patient verbalizes understanding of discharge instructions. Opportunity for questioning and answers were provided. Patient discharged from ED.  °

## 2022-03-11 NOTE — ED Triage Notes (Signed)
Patient reports he noticed onset of feeling heaviness in his chest last night while eating dinner.  The patient also reports headaches over the past week, intermittently.  He states he noticed onset of blurred vision last night and again today.  Patient denies any nausea.  He denies any diaphoresis.  He does admit to feeling tired recently.   Patient reports normal voiding.  Patient denies any falls or near syncope.  Patient has not taken any meds prior to arrival.

## 2022-03-14 ENCOUNTER — Encounter (INDEPENDENT_AMBULATORY_CARE_PROVIDER_SITE_OTHER): Payer: Self-pay | Admitting: Family Medicine

## 2022-03-14 ENCOUNTER — Ambulatory Visit (INDEPENDENT_AMBULATORY_CARE_PROVIDER_SITE_OTHER): Payer: BC Managed Care – PPO | Admitting: Family Medicine

## 2022-03-14 VITALS — BP 115/82 | HR 69 | Temp 98.0°F | Ht 68.0 in | Wt 246.0 lb

## 2022-03-14 DIAGNOSIS — Z6837 Body mass index (BMI) 37.0-37.9, adult: Secondary | ICD-10-CM

## 2022-03-14 DIAGNOSIS — Z1331 Encounter for screening for depression: Secondary | ICD-10-CM | POA: Diagnosis not present

## 2022-03-14 DIAGNOSIS — E669 Obesity, unspecified: Secondary | ICD-10-CM

## 2022-03-14 DIAGNOSIS — R0602 Shortness of breath: Secondary | ICD-10-CM | POA: Diagnosis not present

## 2022-03-14 DIAGNOSIS — G4733 Obstructive sleep apnea (adult) (pediatric): Secondary | ICD-10-CM

## 2022-03-14 DIAGNOSIS — R5383 Other fatigue: Secondary | ICD-10-CM | POA: Insufficient documentation

## 2022-03-15 LAB — CBC WITH DIFFERENTIAL/PLATELET
Basophils Absolute: 0 10*3/uL (ref 0.0–0.2)
Basos: 1 %
EOS (ABSOLUTE): 0.1 10*3/uL (ref 0.0–0.4)
Eos: 1 %
Hematocrit: 47.9 % (ref 37.5–51.0)
Hemoglobin: 16 g/dL (ref 13.0–17.7)
Immature Grans (Abs): 0 10*3/uL (ref 0.0–0.1)
Immature Granulocytes: 0 %
Lymphocytes Absolute: 1.8 10*3/uL (ref 0.7–3.1)
Lymphs: 26 %
MCH: 28.5 pg (ref 26.6–33.0)
MCHC: 33.4 g/dL (ref 31.5–35.7)
MCV: 85 fL (ref 79–97)
Monocytes Absolute: 0.3 10*3/uL (ref 0.1–0.9)
Monocytes: 5 %
Neutrophils Absolute: 4.5 10*3/uL (ref 1.4–7.0)
Neutrophils: 67 %
Platelets: 210 10*3/uL (ref 150–450)
RBC: 5.62 x10E6/uL (ref 4.14–5.80)
RDW: 13 % (ref 11.6–15.4)
WBC: 6.7 10*3/uL (ref 3.4–10.8)

## 2022-03-15 LAB — COMPREHENSIVE METABOLIC PANEL
ALT: 19 IU/L (ref 0–44)
AST: 14 IU/L (ref 0–40)
Albumin/Globulin Ratio: 2.1 (ref 1.2–2.2)
Albumin: 4.8 g/dL (ref 4.1–5.1)
Alkaline Phosphatase: 69 IU/L (ref 44–121)
BUN/Creatinine Ratio: 23 — ABNORMAL HIGH (ref 9–20)
BUN: 21 mg/dL (ref 6–24)
Bilirubin Total: 0.4 mg/dL (ref 0.0–1.2)
CO2: 24 mmol/L (ref 20–29)
Calcium: 9.9 mg/dL (ref 8.7–10.2)
Chloride: 101 mmol/L (ref 96–106)
Creatinine, Ser: 0.91 mg/dL (ref 0.76–1.27)
Globulin, Total: 2.3 g/dL (ref 1.5–4.5)
Glucose: 73 mg/dL (ref 70–99)
Potassium: 4.8 mmol/L (ref 3.5–5.2)
Sodium: 139 mmol/L (ref 134–144)
Total Protein: 7.1 g/dL (ref 6.0–8.5)
eGFR: 109 mL/min/{1.73_m2} (ref >59)

## 2022-03-15 LAB — INSULIN, RANDOM: INSULIN: 6.4 u[IU]/mL (ref 2.6–24.9)

## 2022-03-15 LAB — LIPID PANEL
Chol/HDL Ratio: 3.1 ratio (ref 0.0–5.0)
Cholesterol, Total: 173 mg/dL (ref 100–199)
HDL: 56 mg/dL (ref >39)
LDL Chol Calc (NIH): 106 mg/dL — ABNORMAL HIGH (ref 0–99)
Triglycerides: 55 mg/dL (ref 0–149)
VLDL Cholesterol Cal: 11 mg/dL (ref 5–40)

## 2022-03-15 LAB — TSH: TSH: 1.13 u[IU]/mL (ref 0.450–4.500)

## 2022-03-15 LAB — VITAMIN D 25 HYDROXY (VIT D DEFICIENCY, FRACTURES): Vit D, 25-Hydroxy: 34.3 ng/mL (ref 30.0–100.0)

## 2022-03-15 LAB — VITAMIN B12: Vitamin B-12: 528 pg/mL (ref 232–1245)

## 2022-03-15 LAB — HEMOGLOBIN A1C
Est. average glucose Bld gHb Est-mCnc: 114 mg/dL
Hgb A1c MFr Bld: 5.6 % (ref 4.8–5.6)

## 2022-03-15 LAB — T4, FREE: Free T4: 1.16 ng/dL (ref 0.82–1.77)

## 2022-03-23 NOTE — Progress Notes (Unsigned)
Chief Complaint:   OBESITY Stephen Jenkins (MR# [id number]) is a 41 y.o. male who presents for evaluation and treatment of obesity and related comorbidities. Current BMI is Body mass index is 37.4 kg/m. Stephen Jenkins has been struggling with his weight for many years and has been unsuccessful in either losing weight, maintaining weight loss, or reaching his healthy weight goal.  He was overweight in childhood, played football  Stephen Jenkins is currently in the action stage of change and ready to dedicate time achieving and maintaining a healthier weight. Stephen Jenkins is interested in becoming our patient and working on intensive lifestyle modifications including (but not limited to) diet and exercise for weight loss.  Stephen Jenkins's habits were reviewed today and are as follows: His family eats meals together, he thinks his family will eat healthier with him, his desired weight loss is 56 lbs, he has been heavy most of his life, he started gaining weight after he stopped playing sports in high school, his heaviest weight ever was 258 pounds, he skips meals frequently, he is frequently drinking liquids with calories, he frequently makes poor food choices, he frequently eats larger portions than normal, and he struggles with emotional eating.  Depression Screen Stephen Jenkins's Food and Mood (modified PHQ-9) score was 7.     03/14/2022    8:11 AM  Depression screen PHQ 2/9  Decreased Interest 2  Down, Depressed, Hopeless 1  PHQ - 2 Score 3  Altered sleeping 1  Tired, decreased energy 1  Change in appetite 1  Feeling bad or failure about yourself  0  Trouble concentrating 1  Moving slowly or fidgety/restless 0  Suicidal thoughts 0  PHQ-9 Score 7  Difficult doing work/chores Not difficult at all   Subjective:   1. Other fatigue Reviewed  most recent EKG and IC results.  Stephen Jenkins admits to daytime somnolence and admits to waking up still tired. Patient has a history of symptoms of daytime fatigue, morning  fatigue, and morning headache. Stephen Jenkins generally gets  4-6  hours of sleep per night, and states that he has difficulty falling asleep and nightime awakenings. Snoring is present. Apneic episodes is present. Epworth Sleepiness Score is 8.   2. SOBOE (shortness of breath on exertion) Stephen Jenkins notes increasing shortness of breath with exercising and seems to be worsening over time with weight gain. He notes getting out of breath sooner with activity than he used to. This has not gotten worse recently. Stephen Jenkins denies shortness of breath at rest or orthopnea.  3. OSA (obstructive sleep apnea) He has moderate OSA, has never tried a CPAP.  He has an oral appliance, but using it.   Assessment/Plan:   1. Other fatigue Work on improving sleep time at night.   Stephen Jenkins does feel that his weight is causing his energy to be lower than it should be. Fatigue may be related to obesity, depression or many other causes. Labs will be ordered, and in the meanwhile, Stephen Jenkins will focus on self care including making healthy food choices, increasing physical activity and focusing on stress reduction.  Check labs today.   - VITAMIN D 25 Hydroxy (Vit-D Deficiency, Fractures) - TSH - T4, free - Lipid panel - Insulin, random - Hemoglobin A1c - Comprehensive metabolic panel - Vitamin V76 - CBC with Differential/Platelet  2. SOBOE (shortness of breath on exertion) Chee does feel that he gets out of breath more easily that he used to when he exercises. Stephen Jenkins's shortness of breath appears to be obesity related  and exercise induced. He has agreed to work on weight loss and gradually increase exercise to treat his exercise induced shortness of breath. Will continue to monitor closely.  3. OSA (obstructive sleep apnea) Work on weight loss for treatment.  Try to wear oral appliance and aim for 7 hours of sleep at night.   4. Depression screening May had a positive depression screening. Depression is commonly  associated with obesity and often results in emotional eating behaviors. We will monitor this closely and work on CBT to help improve the non-hunger eating patterns. Referral to Psychology may be required if no improvement is seen as he continues in our clinic.  5. Obesity,current BMI 37.5 Stephen Jenkins is currently in the action stage of change and his goal is to continue with weight loss efforts. I recommend Stephen Jenkins begin the structured treatment plan as follows:  He has agreed to the Category 3 Plan.  Exercise goals:  He is to track his daily steps.    Behavioral modification strategies: increasing lean protein intake, increasing vegetables, increasing water intake, decreasing liquid calories, decreasing eating out, no skipping meals, meal planning and cooking strategies, keeping healthy foods in the home, and planning for success.  He was informed of the importance of frequent follow-up visits to maximize his success with intensive lifestyle modifications for his multiple health conditions. He was informed we would discuss his lab results at his next visit unless there is a critical issue that needs to be addressed sooner. Stephen Jenkins agreed to keep his next visit at the agreed upon time to discuss these results.  Objective:   Blood pressure 115/82, pulse 69, temperature 98 F (36.7 C), height '5\' 8"'$  (1.727 m), weight 246 lb (111.6 kg), SpO2 95 %. Body mass index is 37.4 kg/m.  EKG: Normal sinus rhythm, rate 68 BPM.  Indirect Calorimeter completed today shows a VO2 of 293 and a REE of 2016.  His calculated basal metabolic rate is 3149 thus his basal metabolic rate is worse than expected.  General: Cooperative, alert, well developed, in no acute distress. HEENT: Conjunctivae and lids unremarkable. Cardiovascular: Regular rhythm.  Lungs: Normal work of breathing. Neurologic: No focal deficits.   Lab Results  Component Value Date   CREATININE 0.91 03/14/2022   BUN 21 03/14/2022   NA 139  03/14/2022   K 4.8 03/14/2022   CL 101 03/14/2022   CO2 24 03/14/2022   Lab Results  Component Value Date   ALT 19 03/14/2022   AST 14 03/14/2022   ALKPHOS 69 03/14/2022   BILITOT 0.4 03/14/2022   Lab Results  Component Value Date   HGBA1C 5.6 03/14/2022   Lab Results  Component Value Date   INSULIN 6.4 03/14/2022   Lab Results  Component Value Date   TSH 1.130 03/14/2022   Lab Results  Component Value Date   CHOL 173 03/14/2022   HDL 56 03/14/2022   LDLCALC 106 (H) 03/14/2022   TRIG 55 03/14/2022   CHOLHDL 3.1 03/14/2022   Lab Results  Component Value Date   WBC 6.7 03/14/2022   HGB 16.0 03/14/2022   HCT 47.9 03/14/2022   MCV 85 03/14/2022   PLT 210 03/14/2022   No results found for: "IRON", "TIBC", "FERRITIN"  Attestation Statements:   Reviewed by clinician on day of visit: allergies, medications, problem list, medical history, surgical history, family history, social history, and previous encounter notes.  I, Davy Pique, am acting as Location manager for Loyal Gambler, DO.  I have reviewed the above  documentation for accuracy and completeness, and I agree with the above. Dell Ponto, DO

## 2022-03-28 ENCOUNTER — Encounter (INDEPENDENT_AMBULATORY_CARE_PROVIDER_SITE_OTHER): Payer: Self-pay | Admitting: Family Medicine

## 2022-03-28 ENCOUNTER — Ambulatory Visit (INDEPENDENT_AMBULATORY_CARE_PROVIDER_SITE_OTHER): Payer: BC Managed Care – PPO | Admitting: Family Medicine

## 2022-03-28 VITALS — BP 116/81 | HR 72 | Temp 98.4°F | Ht 68.0 in | Wt 244.0 lb

## 2022-03-28 DIAGNOSIS — E559 Vitamin D deficiency, unspecified: Secondary | ICD-10-CM | POA: Diagnosis not present

## 2022-03-28 DIAGNOSIS — E669 Obesity, unspecified: Secondary | ICD-10-CM

## 2022-03-28 DIAGNOSIS — Z6837 Body mass index (BMI) 37.0-37.9, adult: Secondary | ICD-10-CM

## 2022-03-28 DIAGNOSIS — G4733 Obstructive sleep apnea (adult) (pediatric): Secondary | ICD-10-CM

## 2022-03-28 MED ORDER — VITAMIN D (ERGOCALCIFEROL) 1.25 MG (50000 UNIT) PO CAPS: 50000.0000 [IU] | ORAL_CAPSULE | ORAL | 0 refills | Status: DC

## 2022-04-06 NOTE — Progress Notes (Signed)
Chief Complaint:   OBESITY Stephen Jenkins is here to discuss his progress with his obesity treatment plan along with follow-up of his obesity related diagnoses. Stephen Jenkins is on the Category 3 Plan and states he is following his eating plan approximately 90% of the time. Stephen Jenkins states he is not exercising.   Today's visit was #: 2 Starting weight: 246 lbs Starting date: 03/14/2022 Today's weight: 244 lbs Today's date: 03/28/2022 Total lbs lost to date: 2 lbs Total lbs lost since last in-office visit: 2 lbs  Interim History: He traveled with his daughter for a travel ball.  He made good choices with meals out.  He is still skipping breakfast sometimes.  He had some sweet tea this weekend and a few mochas.  He is getting in some fruits and veggies.  Time limits exercise.   Subjective:   1. Vitamin D deficiency New diagnosis. Discussed labs with patient today. Vitamin D level low, normal 34.3.  He complains of fatigue.   2. Moderate OSA (obstructive sleep apnea) Starting use of oral appliance, complains of fatigue.    Assessment/Plan:   1. Vitamin D deficiency Recheck level in 3-4 months.  Begin - Vitamin D, Ergocalciferol, (DRISDOL) 1.25 MG (50000 UNIT) CAPS capsule; Take 1 capsule (50,000 Units total) by mouth every 7 (seven) days.  Dispense: 5 capsule; Refill: 0  2. Moderate OSA (obstructive sleep apnea) Aim for 7-8 hours of sleep with oral appliance.   3. Obesity,current BMI 37.2 1) Avoid meal skipping.  He can substitute a protein shake instead of skipping.   2) Carry beef jerky, protein bars to softball tournaments.   Stephen Jenkins is currently in the action stage of change. As such, his goal is to continue with weight loss efforts. He has agreed to the Category 3 Plan.   Exercise goals:  Add in at least one walk each week.   Behavioral modification strategies: increasing lean protein intake, increasing vegetables, increasing water intake, decreasing liquid calories, decreasing  alcohol intake, increasing high fiber foods, no skipping meals, meal planning and cooking strategies, keeping healthy foods in the home, better snacking choices, and decreasing junk food.  Stephen Jenkins has agreed to follow-up with our clinic in 3 weeks. He was informed of the importance of frequent follow-up visits to maximize his success with intensive lifestyle modifications for his multiple health conditions.   Objective:   Blood pressure 116/81, pulse 72, temperature 98.4 F (36.9 C), height '5\' 8"'$  (1.727 m), weight 244 lb (110.7 kg), SpO2 94 %. Body mass index is 37.1 kg/m.  General: Cooperative, alert, well developed, in no acute distress. HEENT: Conjunctivae and lids unremarkable. Cardiovascular: Regular rhythm.  Lungs: Normal work of breathing. Neurologic: No focal deficits.   Lab Results  Component Value Date   CREATININE 0.91 03/14/2022   BUN 21 03/14/2022   NA 139 03/14/2022   K 4.8 03/14/2022   CL 101 03/14/2022   CO2 24 03/14/2022   Lab Results  Component Value Date   ALT 19 03/14/2022   AST 14 03/14/2022   ALKPHOS 69 03/14/2022   BILITOT 0.4 03/14/2022   Lab Results  Component Value Date   HGBA1C 5.6 03/14/2022   Lab Results  Component Value Date   INSULIN 6.4 03/14/2022   Lab Results  Component Value Date   TSH 1.130 03/14/2022   Lab Results  Component Value Date   CHOL 173 03/14/2022   HDL 56 03/14/2022   LDLCALC 106 (H) 03/14/2022   TRIG 55 03/14/2022  CHOLHDL 3.1 03/14/2022   Lab Results  Component Value Date   VD25OH 34.3 03/14/2022   Lab Results  Component Value Date   WBC 6.7 03/14/2022   HGB 16.0 03/14/2022   HCT 47.9 03/14/2022   MCV 85 03/14/2022   PLT 210 03/14/2022   No results found for: "IRON", "TIBC", "FERRITIN"  Attestation Statements:   Reviewed by clinician on day of visit: allergies, medications, problem list, medical history, surgical history, family history, social history, and previous encounter notes.  I, Davy Pique, am acting as Location manager for Loyal Gambler, DO.  I have reviewed the above documentation for accuracy and completeness, and I agree with the above. Dell Ponto, DO

## 2022-04-21 ENCOUNTER — Ambulatory Visit (INDEPENDENT_AMBULATORY_CARE_PROVIDER_SITE_OTHER): Payer: BC Managed Care – PPO | Admitting: Family Medicine

## 2022-04-21 ENCOUNTER — Encounter (INDEPENDENT_AMBULATORY_CARE_PROVIDER_SITE_OTHER): Payer: Self-pay | Admitting: Family Medicine

## 2022-04-21 VITALS — BP 115/80 | HR 79 | Temp 97.9°F | Ht 68.0 in | Wt 238.0 lb

## 2022-04-21 DIAGNOSIS — G4733 Obstructive sleep apnea (adult) (pediatric): Secondary | ICD-10-CM | POA: Diagnosis not present

## 2022-04-21 DIAGNOSIS — E559 Vitamin D deficiency, unspecified: Secondary | ICD-10-CM

## 2022-04-21 DIAGNOSIS — Z6836 Body mass index (BMI) 36.0-36.9, adult: Secondary | ICD-10-CM

## 2022-04-21 DIAGNOSIS — E669 Obesity, unspecified: Secondary | ICD-10-CM | POA: Diagnosis not present

## 2022-04-21 MED ORDER — VITAMIN D (ERGOCALCIFEROL) 1.25 MG (50000 UNIT) PO CAPS: 50000.0000 [IU] | ORAL_CAPSULE | ORAL | 0 refills | Status: DC

## 2022-05-03 NOTE — Progress Notes (Signed)
Chief Complaint:   OBESITY Stephen Jenkins is here to discuss his progress with his obesity treatment plan along with follow-up of his obesity related diagnoses. Stephen Jenkins is on the Category 3 Plan and states he is following his eating plan approximately 85% of the time. Demarr states he is not exercising.  Today's visit was #: 3 Starting weight: 246 lbs Starting date: 03/14/2022 Today's weight: 238 lbs Today's date: 04/21/2022 Total lbs lost to date: 8 lbs Total lbs lost since last in-office visit: 6 lbs  Interim History: Cut back on fatty meats.  Still eating some bagels.  Getting in more lean proteins.  Added in more fruits and veggies.  He is off SSB's.  Drinks plenty of water some diet soda.   Subjective:   1. Vitamin D deficiency He is currently taking prescription vitamin D 50,000 IU each week. He denies nausea, vomiting or muscle weakness.  Vitamin D level, 34.3.   2. OSA (obstructive sleep apnea) Awaiting oral appliance for sleep apnea.  Lacking adequate sleep.   Assessment/Plan:   1. Vitamin D deficiency Recheck level in 3-4 months.  Refill - Vitamin D, Ergocalciferol, (DRISDOL) 1.25 MG (50000 UNIT) CAPS capsule; Take 1 capsule (50,000 Units total) by mouth every 7 (seven) days.  Dispense: 5 capsule; Refill: 0  2. OSA (obstructive sleep apnea) Aim for 7 hours of sleep and oral appliance.   3. Obesity,current BMI 36.3 1) Reviewed overall progress, down 2 lbs in one month.  2) Visceral fat rating decreased by 2.  3) Reviewed high protein snacks.   Stephen Jenkins is currently in the action stage of change. As such, his goal is to continue with weight loss efforts. He has agreed to practicing portion control and making smarter food choices, such as increasing vegetables and decreasing simple carbohydrates.   Exercise goals: All adults should avoid inactivity. Some physical activity is better than none, and adults who participate in any amount of physical activity gain some health  benefits.  Behavioral modification strategies: increasing lean protein intake, increasing vegetables, increasing water intake, increasing high fiber foods, no skipping meals, meal planning and cooking strategies, keeping healthy foods in the home, and decreasing junk food.  Stephen Jenkins has agreed to follow-up with our clinic in 3 weeks. He was informed of the importance of frequent follow-up visits to maximize his success with intensive lifestyle modifications for his multiple health conditions.   Objective:   Blood pressure 115/80, pulse 79, temperature 97.9 F (36.6 C), height '5\' 8"'$  (1.727 m), weight 238 lb (108 kg), SpO2 97 %. Body mass index is 36.19 kg/m.  General: Cooperative, alert, well developed, in no acute distress. HEENT: Conjunctivae and lids unremarkable. Cardiovascular: Regular rhythm.  Lungs: Normal work of breathing. Neurologic: No focal deficits.   Lab Results  Component Value Date   CREATININE 0.91 03/14/2022   BUN 21 03/14/2022   NA 139 03/14/2022   K 4.8 03/14/2022   CL 101 03/14/2022   CO2 24 03/14/2022   Lab Results  Component Value Date   ALT 19 03/14/2022   AST 14 03/14/2022   ALKPHOS 69 03/14/2022   BILITOT 0.4 03/14/2022   Lab Results  Component Value Date   HGBA1C 5.6 03/14/2022   Lab Results  Component Value Date   INSULIN 6.4 03/14/2022   Lab Results  Component Value Date   TSH 1.130 03/14/2022   Lab Results  Component Value Date   CHOL 173 03/14/2022   HDL 56 03/14/2022   LDLCALC 106 (  H) 03/14/2022   TRIG 55 03/14/2022   CHOLHDL 3.1 03/14/2022   Lab Results  Component Value Date   VD25OH 34.3 03/14/2022   Lab Results  Component Value Date   WBC 6.7 03/14/2022   HGB 16.0 03/14/2022   HCT 47.9 03/14/2022   MCV 85 03/14/2022   PLT 210 03/14/2022   No results found for: "IRON", "TIBC", "FERRITIN"  Attestation Statements:   Reviewed by clinician on day of visit: allergies, medications, problem list, medical history, surgical  history, family history, social history, and previous encounter notes.  I, Davy Pique, am acting as Location manager for Loyal Gambler, DO.  I have reviewed the above documentation for accuracy and completeness, and I agree with the above. Dell Ponto, DO

## 2022-05-17 ENCOUNTER — Ambulatory Visit (INDEPENDENT_AMBULATORY_CARE_PROVIDER_SITE_OTHER): Payer: BC Managed Care – PPO | Admitting: Family Medicine

## 2022-05-17 ENCOUNTER — Encounter (INDEPENDENT_AMBULATORY_CARE_PROVIDER_SITE_OTHER): Payer: Self-pay | Admitting: Family Medicine

## 2022-05-17 VITALS — BP 128/84 | HR 86 | Temp 98.0°F | Ht 68.0 in | Wt 238.0 lb

## 2022-05-17 DIAGNOSIS — Z566 Other physical and mental strain related to work: Secondary | ICD-10-CM | POA: Diagnosis not present

## 2022-05-17 DIAGNOSIS — E669 Obesity, unspecified: Secondary | ICD-10-CM

## 2022-05-17 DIAGNOSIS — Z6836 Body mass index (BMI) 36.0-36.9, adult: Secondary | ICD-10-CM

## 2022-05-17 DIAGNOSIS — E559 Vitamin D deficiency, unspecified: Secondary | ICD-10-CM

## 2022-05-17 MED ORDER — VITAMIN D (ERGOCALCIFEROL) 1.25 MG (50000 UNIT) PO CAPS: 50000.0000 [IU] | ORAL_CAPSULE | ORAL | 0 refills | Status: DC

## 2022-05-25 NOTE — Progress Notes (Addendum)
Chief Complaint:   OBESITY Stephen Jenkins is here to discuss his progress with his obesity treatment plan along with follow-up of his obesity related diagnoses. Stephen Jenkins is on practicing portion control and making smarter food choices, such as increasing vegetables and decreasing simple carbohydrates and states he is following his eating plan approximately 20% of the time. Stephen Jenkins states he is exercising.  Today's visit was #: 4 Starting weight: 16 LBS Starting date: 03/14/2022 Today's weight: 238 LBS Today's date: 05/17/2022 Total lbs lost to date: 8 LBS Total lbs lost since last in-office visit: 0  Interim History: At times patient is skipping meals due to work schedule.  He was mindful over Thanksgiving.  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  Stress levels are higher with less sleep at night.  He has been choosing leaner meats.  He is lacking fruits and veggies.  He has been getting takeout for dinner more often.  Subjective:   1. Vitamin D deficiency Patient is on prescription vitamin D weekly.  Last vitamin D level on 03/14/2022, was 34.3.  2. Stress at work Long work house leaves little time for meal prep and exercise.  Sleep has been lacking.  His wife is supportive.  Planning vacation in May.  Assessment/Plan:   1. Vitamin D deficiency Refill- Vitamin D, Ergocalciferol, (DRISDOL) 1.25 MG (50000 UNIT) CAPS capsule; Take 1 capsule (50,000 Units total) by mouth every 7 (seven) days.  Dispense: 5 capsule; Refill: 0  2. Stress at work Plan for 20 to 30 minutes of walking or weights 2 times a week.  Increase sleep time to at least 6 hours per night or more and bring grab and go fresh fruit to work.  Keep water at desk to increase intake.  3. Obesity,current BMI 36.3 Discussed premade dinner including Factor 75 and [employer] premade meals instead of eating out.  Stephen Jenkins is currently in the action stage of change. As such, his goal is to continue with weight loss efforts. He  has agreed to the Category 3 Plan and keeping a food journal and adhering to recommended goals of 600 calories and 30+ protein at supper.  Exercise goals:  Plans to increase walking time.  Behavioral modification strategies: increasing lean protein intake, increasing vegetables, increasing water intake, decreasing eating out, no skipping meals, meal planning and cooking strategies, keeping healthy foods in the home, better snacking choices, holiday eating strategies , and avoiding temptations.  Stephen Jenkins has agreed to follow-up with our clinic in 4 weeks. He was informed of the importance of frequent follow-up visits to maximize his success with intensive lifestyle modifications for his multiple health conditions.   Objective:   Blood pressure 128/84, pulse 86, temperature 98 F (36.7 C), height '5\' 8"'$  (1.727 m), weight 238 lb (108 kg), SpO2 97 %. Body mass index is 36.19 kg/m.  General: Cooperative, alert, well developed, in no acute distress. HEENT: Conjunctivae and lids unremarkable. Cardiovascular: Regular rhythm.  Lungs: Normal work of breathing. Neurologic: No focal deficits.   Lab Results  Component Value Date   CREATININE 0.91 03/14/2022   BUN 21 03/14/2022   NA 139 03/14/2022   K 4.8 03/14/2022   CL 101 03/14/2022   CO2 24 03/14/2022   Lab Results  Component Value Date   ALT 19 03/14/2022   AST 14 03/14/2022   ALKPHOS 69 03/14/2022   BILITOT 0.4 03/14/2022   Lab Results  Component Value Date   HGBA1C 5.6 03/14/2022   Lab Results  Component Value Date   INSULIN 6.4 03/14/2022   Lab Results  Component Value Date   TSH 1.130 03/14/2022   Lab Results  Component Value Date   CHOL 173 03/14/2022   HDL 56 03/14/2022   LDLCALC 106 (H) 03/14/2022   TRIG 55 03/14/2022   CHOLHDL 3.1 03/14/2022   Lab Results  Component Value Date   VD25OH 34.3 03/14/2022   Lab Results  Component Value Date   WBC 6.7 03/14/2022   HGB 16.0 03/14/2022   HCT 47.9 03/14/2022    MCV 85 03/14/2022   PLT 210 03/14/2022   No results found for: "IRON", "TIBC", "FERRITIN"  Attestation Statements:   Reviewed by clinician on day of visit: allergies, medications, problem list, medical history, surgical history, family history, social history, and previous encounter notes.  I, Davy Pique, am acting as Location manager for Loyal Gambler, DO.  I have reviewed the above documentation for accuracy and completeness, and I agree with the above. Dell Ponto, DO

## 2022-06-12 ENCOUNTER — Other Ambulatory Visit (INDEPENDENT_AMBULATORY_CARE_PROVIDER_SITE_OTHER): Payer: Self-pay | Admitting: Family Medicine

## 2022-06-12 DIAGNOSIS — E559 Vitamin D deficiency, unspecified: Secondary | ICD-10-CM

## 2022-06-21 ENCOUNTER — Encounter (INDEPENDENT_AMBULATORY_CARE_PROVIDER_SITE_OTHER): Payer: Self-pay | Admitting: Family Medicine

## 2022-06-21 ENCOUNTER — Encounter (HOSPITAL_BASED_OUTPATIENT_CLINIC_OR_DEPARTMENT_OTHER): Payer: Self-pay

## 2022-06-21 ENCOUNTER — Other Ambulatory Visit (HOSPITAL_BASED_OUTPATIENT_CLINIC_OR_DEPARTMENT_OTHER): Payer: Self-pay

## 2022-06-21 ENCOUNTER — Ambulatory Visit (INDEPENDENT_AMBULATORY_CARE_PROVIDER_SITE_OTHER): Payer: BC Managed Care – PPO | Admitting: Family Medicine

## 2022-06-21 VITALS — BP 129/83 | HR 59 | Temp 98.3°F | Ht 68.0 in | Wt 241.0 lb

## 2022-06-21 DIAGNOSIS — Z6836 Body mass index (BMI) 36.0-36.9, adult: Secondary | ICD-10-CM

## 2022-06-21 DIAGNOSIS — E669 Obesity, unspecified: Secondary | ICD-10-CM

## 2022-06-21 DIAGNOSIS — E559 Vitamin D deficiency, unspecified: Secondary | ICD-10-CM | POA: Diagnosis not present

## 2022-06-21 DIAGNOSIS — G4733 Obstructive sleep apnea (adult) (pediatric): Secondary | ICD-10-CM

## 2022-06-21 MED ORDER — VITAMIN D (ERGOCALCIFEROL) 1.25 MG (50000 UNIT) PO CAPS: 50000.0000 [IU] | ORAL_CAPSULE | ORAL | 0 refills | Status: DC

## 2022-06-21 MED ORDER — WEGOVY 0.25 MG/0.5ML ~~LOC~~ SOAJ
0.2500 mg | SUBCUTANEOUS | 0 refills | Status: DC
  Filled 2022-06-21: qty 2, fill #0
  Filled 2022-06-28: qty 2, 28d supply, fill #0

## 2022-06-28 ENCOUNTER — Other Ambulatory Visit (HOSPITAL_BASED_OUTPATIENT_CLINIC_OR_DEPARTMENT_OTHER): Payer: Self-pay

## 2022-06-28 ENCOUNTER — Other Ambulatory Visit (HOSPITAL_COMMUNITY): Payer: Self-pay

## 2022-06-29 ENCOUNTER — Encounter (INDEPENDENT_AMBULATORY_CARE_PROVIDER_SITE_OTHER): Payer: Self-pay | Admitting: *Deleted

## 2022-06-29 ENCOUNTER — Other Ambulatory Visit (HOSPITAL_BASED_OUTPATIENT_CLINIC_OR_DEPARTMENT_OTHER): Payer: Self-pay

## 2022-07-01 ENCOUNTER — Other Ambulatory Visit (HOSPITAL_BASED_OUTPATIENT_CLINIC_OR_DEPARTMENT_OTHER): Payer: Self-pay

## 2022-07-02 ENCOUNTER — Other Ambulatory Visit (HOSPITAL_BASED_OUTPATIENT_CLINIC_OR_DEPARTMENT_OTHER): Payer: Self-pay

## 2022-07-07 ENCOUNTER — Encounter (INDEPENDENT_AMBULATORY_CARE_PROVIDER_SITE_OTHER): Payer: Self-pay

## 2022-07-13 ENCOUNTER — Other Ambulatory Visit (HOSPITAL_BASED_OUTPATIENT_CLINIC_OR_DEPARTMENT_OTHER): Payer: Self-pay

## 2022-07-13 NOTE — Progress Notes (Signed)
Chief Complaint:   OBESITY Stephen Jenkins is here to discuss his progress with his obesity treatment plan along with follow-up of his obesity related diagnoses. Stephen Jenkins is on the Category 3 Plan and states he is following his eating plan approximately 85-90% of the time. Stephen Jenkins states he is not exercising.   Today's visit was #: 5 Starting weight: 246 lbs Starting date: 03/14/2022 Today's weight: 241 lbs Today's date: 06/21/2022 Total lbs lost to date: 5 lbs Total lbs lost since last in-office visit: +3 lbs  Interim History: Doing better getting protein in with meals.  No longer meal skipping.  Has been managing stress better.  Sleep is still lacking.  He got a puppy.  She plans to start walking the dog more.   Subjective:   1. OSA (obstructive sleep apnea) Wearing oral appliance at night for OSA.  Has struggles with compliance.   2. Vitamin D deficiency Patient is on prescription Vitamin D 50,000 IU weekly.  03/14/2022, Vitamin D 34.  Energy level improving.    Assessment/Plan:   1. OSA (obstructive sleep apnea) Work on improving compliance of oral appliance, aiming for 7-8 hours of sleep at night.   2. Vitamin D deficiency Recheck level in 2 months.   Refill - Vitamin D, Ergocalciferol, (DRISDOL) 1.25 MG (50000 UNIT) CAPS capsule; Take 1 capsule (50,000 Units total) by mouth every 7 (seven) days.  Dispense: 12 capsule; Refill: 0  3. Obesity,current BMI 36.7 Patient denies a personal or family history of pancreatitis, medullary thyroid carcinoma or multiple endocrine neoplasia type II. Recommend reviewing pen training video online.  Begin- Semaglutide-Weight Management (WEGOVY) 0.25 MG/0.5ML SOAJ; Inject 0.25 mg into the skin once a week.  Dispense: 2 mL; Refill: 0  Stephen Jenkins is currently in the action stage of change. As such, his goal is to continue with weight loss efforts. He has agreed to the Category 3 Plan.   Exercise goals: All adults should avoid inactivity. Some  physical activity is better than none, and adults who participate in any amount of physical activity gain some health benefits.  Behavioral modification strategies: increasing lean protein intake, increasing vegetables, increasing water intake, decreasing eating out, no skipping meals, meal planning and cooking strategies, avoiding temptations, and planning for success.  [name] has agreed to follow-up with our clinic in 4 weeks. He was informed of the importance of frequent follow-up visits to maximize his success with intensive lifestyle modifications for his multiple health conditions.   Objective:   Blood pressure 129/83, pulse (!) 59, temperature 98.3 F (36.8 C), height 5' 8"$  (1.727 m), weight 241 lb (109.3 kg), SpO2 99 %. Body mass index is 36.64 kg/m.  General: Cooperative, alert, well developed, in no acute distress. HEENT: Conjunctivae and lids unremarkable. Cardiovascular: Regular rhythm.  Lungs: Normal work of breathing. Neurologic: No focal deficits.   Lab Results  Component Value Date   CREATININE 0.91 03/14/2022   BUN 21 03/14/2022   NA 139 03/14/2022   K 4.8 03/14/2022   CL 101 03/14/2022   CO2 24 03/14/2022   Lab Results  Component Value Date   ALT 19 03/14/2022   AST 14 03/14/2022   ALKPHOS 69 03/14/2022   BILITOT 0.4 03/14/2022   Lab Results  Component Value Date   HGBA1C 5.6 03/14/2022   Lab Results  Component Value Date   INSULIN 6.4 03/14/2022   Lab Results  Component Value Date   TSH 1.130 03/14/2022   Lab Results  Component Value Date  CHOL 173 03/14/2022   HDL 56 03/14/2022   LDLCALC 106 (H) 03/14/2022   TRIG 55 03/14/2022   CHOLHDL 3.1 03/14/2022   Lab Results  Component Value Date   VD25OH 34.3 03/14/2022   Lab Results  Component Value Date   WBC 6.7 03/14/2022   HGB 16.0 03/14/2022   HCT 47.9 03/14/2022   MCV 85 03/14/2022   PLT 210 03/14/2022   No results found for: "IRON", "TIBC", "FERRITIN"  Attestation Statements:    Reviewed by clinician on day of visit: allergies, medications, problem list, medical history, surgical history, family history, social history, and previous encounter notes.  I have personally spent 30 minutes total time today in preparation, patient care, and documentation for this visit, including the following: review of clinical lab tests; review of medical tests/procedures/services and nutritional counseling.  I, Davy Pique, am acting as Location manager for Loyal Gambler, DO.  I have reviewed the above documentation for accuracy and completeness, and I agree with the above. Dell Ponto, DO

## 2022-07-13 NOTE — Telephone Encounter (Signed)
Received fax from patients insurance for the Prior authorization for his [facility]. I have faxed it back to [facility] [phone].Waiting on determination.

## 2022-07-19 ENCOUNTER — Other Ambulatory Visit (HOSPITAL_BASED_OUTPATIENT_CLINIC_OR_DEPARTMENT_OTHER): Payer: Self-pay

## 2022-07-20 ENCOUNTER — Encounter (INDEPENDENT_AMBULATORY_CARE_PROVIDER_SITE_OTHER): Payer: Self-pay | Admitting: Family Medicine

## 2022-07-20 ENCOUNTER — Ambulatory Visit (INDEPENDENT_AMBULATORY_CARE_PROVIDER_SITE_OTHER): Payer: BC Managed Care – PPO | Admitting: Family Medicine

## 2022-07-20 ENCOUNTER — Other Ambulatory Visit (HOSPITAL_BASED_OUTPATIENT_CLINIC_OR_DEPARTMENT_OTHER): Payer: Self-pay

## 2022-07-20 VITALS — BP 117/82 | HR 76 | Temp 98.6°F | Ht 68.0 in | Wt 240.0 lb

## 2022-07-20 DIAGNOSIS — Z6836 Body mass index (BMI) 36.0-36.9, adult: Secondary | ICD-10-CM | POA: Diagnosis not present

## 2022-07-20 DIAGNOSIS — E559 Vitamin D deficiency, unspecified: Secondary | ICD-10-CM

## 2022-07-20 DIAGNOSIS — Z566 Other physical and mental strain related to work: Secondary | ICD-10-CM | POA: Diagnosis not present

## 2022-07-20 DIAGNOSIS — E669 Obesity, unspecified: Secondary | ICD-10-CM

## 2022-07-20 MED ORDER — WEGOVY 0.25 MG/0.5ML ~~LOC~~ SOAJ
0.2500 mg | SUBCUTANEOUS | 0 refills | Status: DC
  Filled 2022-07-20: qty 2, 28d supply, fill #0

## 2022-07-20 NOTE — Progress Notes (Signed)
Office: [phone]  /  Fax: [phone]  WEIGHT SUMMARY AND BIOMETRICS  Medical Weight Loss Height: 5' 8"$  (1.727 m) Weight: 240 lb (108.9 kg) Temp: 98.6 F (37 C) Pulse Rate: 76 BP: 117/82 SpO2: 96 % Fasting: no Labs: no Today's Visit #: 6 Weight at Last VIsit: 241lb Weight Lost Since Last Visit: 1lb  Body Fat %: 33.8 % Fat Mass (lbs): 81.2 lbs Muscle Mass (lbs): 151.4 lbs Total Body Water (lbs): 113.6 lbs Visceral Fat Rating : 17 Starting Date: 03/14/22 Starting Weight: 246lb Total Weight Loss (lbs): 6 lb (2.722 kg)    HPI  Chief Complaint: OBESITY  Stephen Jenkins is here to discuss his progress with his obesity treatment plan. He is on the the Category 3 Plan and states he is following his eating plan approximately 40 % of the time. He states he is not exercising.    Interval History:  Since last office visit he is down 1 lb.  This gives him a weight loss of 6 lb in 4 mos of medically supervised weight management.  Stress at work has been high.  This has lead to skipping meals and eating out more late at night. Lacking sleep at night.  Has had little time for exercise.  Down 2 lb of muscle mass Up 1.2 lb of body fat  Pharmacotherapy: Mancel Parsons (has not started yet but insurance did approve)  PHYSICAL EXAM:  Blood pressure 117/82, pulse 76, temperature 98.6 F (37 C), height 5' 8"$  (1.727 m), weight 240 lb (108.9 kg), SpO2 96 %. Body mass index is 36.49 kg/m.  General: He is overweight, cooperative, alert, well developed, and in no acute distress. PSYCH: Has normal mood, affect and thought process.   HEENT: EOMI, sclerae are anicteric. Lungs: Normal breathing effort, no conversational dyspnea. Extremities: No edema.  Neurologic: No gross sensory or motor deficits. No tremors or fasciculations noted.    DIAGNOSTIC DATA REVIEWED:  BMET    Component Value Date/Time   NA 139 03/14/2022 0923   K 4.8 03/14/2022 0923   CL 101 03/14/2022 0923   CO2 24 03/14/2022  0923   GLUCOSE 73 03/14/2022 0923   GLUCOSE 94 03/11/2022 0652   BUN 21 03/14/2022 0923   CREATININE 0.91 03/14/2022 0923   CALCIUM 9.9 03/14/2022 0923   GFRNONAA >60 03/11/2022 0652   Lab Results  Component Value Date   HGBA1C 5.6 03/14/2022   Lab Results  Component Value Date   INSULIN 6.4 03/14/2022   Lab Results  Component Value Date   TSH 1.130 03/14/2022   CBC    Component Value Date/Time   WBC 6.7 03/14/2022 0923   WBC 5.5 03/11/2022 0652   RBC 5.62 03/14/2022 0923   RBC 5.39 03/11/2022 0652   HGB 16.0 03/14/2022 0923   HCT 47.9 03/14/2022 0923   PLT 210 03/14/2022 0923   MCV 85 03/14/2022 0923   MCH 28.5 03/14/2022 0923   MCH 29.1 03/11/2022 0652   MCHC 33.4 03/14/2022 0923   MCHC 34.7 03/11/2022 0652   RDW 13.0 03/14/2022 0923   Iron Studies No results found for: "IRON", "TIBC", "FERRITIN", "IRONPCTSAT" Lipid Panel     Component Value Date/Time   CHOL 173 03/14/2022 0923   TRIG 55 03/14/2022 0923   HDL 56 03/14/2022 0923   CHOLHDL 3.1 03/14/2022 0923   LDLCALC 106 (H) 03/14/2022 0923   Hepatic Function Panel     Component Value Date/Time   PROT 7.1 03/14/2022 0923   ALBUMIN 4.8 03/14/2022 0923  AST 14 03/14/2022 0923   ALT 19 03/14/2022 0923   ALKPHOS 69 03/14/2022 0923   BILITOT 0.4 03/14/2022 0923      Component Value Date/Time   TSH 1.130 03/14/2022 0923   Nutritional Lab Results  Component Value Date   VD25OH 34.3 03/14/2022     ASSESSMENT AND PLAN  TREATMENT PLAN FOR OBESITY:  Recommended Dietary Goals  Stephen Jenkins is currently in the action stage of change. As such, his goal is to continue weight management plan. He has agreed to the Category 3 Plan.  Behavioral Intervention  We discussed the following Behavioral Modification Strategies today: increasing lean protein intake, increasing vegetables, avoid skipping meals, increase water intake, decrease eating out, work on meal planning and easy cooking plans, and think about  ways to increase physical activity.  Additional resources provided today: NA  Recommended Physical Activity Goals  Stephen Jenkins has been advised to work up to 150 minutes of moderate intensity aerobic activity a week and strengthening exercises 2-3 times per week for cardiovascular health, weight loss maintenance and preservation of muscle mass.   He has agreed to increase physical activity in their day and reduce sedentary time (increase NEAT).   Plans to walk the dogs more and start more yardwork.  Pharmacotherapy We discussed various medication options to help Stephen Jenkins with his weight loss efforts and we both agreed to starting Wegovy 0.25 mg weekly (hasn't started yet).  ASSOCIATED CONDITIONS ADDRESSED TODAY  Stress at work Assessment & Plan: Worsening Still working 2 jobs including running a business Lacking adequate sleep at night  Discussed stress reduction techniques and making time for self-care   Vitamin D deficiency Assessment & Plan: Taking prescription vitamin D 50,000 IU once weekly. Plan to recheck labs at next visit.  Last vitamin D Lab Results  Component Value Date   VD25OH 34.3 03/14/2022      Obesity,current BMI 36.7 -     Wegovy; Inject 0.25 mg into the skin once a week.  Dispense: 2 mL; Refill: 0  Morbid obesity (HCC) Assessment & Plan: High stress and lack of time, lack of sleep have been obstacles to his success over the past 4 weeks.  We have discussed the importance of making time for self-care, sleep and healthy meals.  Encouraged 1 hour 3 times a week of outdoor walking or yard work for time alone.  Will work on restarting meal plan. Start Wegovy 0.25 mg once weekly injection if available at pharmacy.  Prescription was resent today for number 2 mL, no refill.       Return in about 4 weeks (around 08/17/2022).Marland Kitchen He was informed of the importance of frequent follow up visits to maximize his success with intensive lifestyle modifications for his  multiple health conditions.   ATTESTASTION STATEMENTS:  Reviewed by clinician on day of visit: allergies, medications, problem list, medical history, surgical history, family history, social history, and previous encounter notes.   Time spent on visit including pre-visit chart review and post-visit care and charting was 30 minutes.    Dell Ponto, DO

## 2022-07-20 NOTE — Assessment & Plan Note (Signed)
Worsening Still working 2 jobs including running a business Lacking adequate sleep at night  Discussed stress reduction techniques and making time for self-care

## 2022-07-20 NOTE — Assessment & Plan Note (Signed)
High stress and lack of time, lack of sleep have been obstacles to his success over the past 4 weeks.  We have discussed the importance of making time for self-care, sleep and healthy meals.  Encouraged 1 hour 3 times a week of outdoor walking or yard work for time alone.  Will work on restarting meal plan. Start Wegovy 0.25 mg once weekly injection if available at pharmacy.  Prescription was resent today for number 2 mL, no refill.

## 2022-07-20 NOTE — Assessment & Plan Note (Signed)
Taking prescription vitamin D 50,000 IU once weekly. Plan to recheck labs at next visit.  Last vitamin D Lab Results  Component Value Date   VD25OH 34.3 03/14/2022

## 2022-08-05 DIAGNOSIS — Z0000 Encounter for general adult medical examination without abnormal findings: Secondary | ICD-10-CM | POA: Diagnosis not present

## 2022-08-16 ENCOUNTER — Other Ambulatory Visit (HOSPITAL_BASED_OUTPATIENT_CLINIC_OR_DEPARTMENT_OTHER): Payer: Self-pay

## 2022-08-16 ENCOUNTER — Ambulatory Visit (INDEPENDENT_AMBULATORY_CARE_PROVIDER_SITE_OTHER): Payer: BC Managed Care – PPO | Admitting: Family Medicine

## 2022-08-16 ENCOUNTER — Encounter (INDEPENDENT_AMBULATORY_CARE_PROVIDER_SITE_OTHER): Payer: Self-pay | Admitting: Family Medicine

## 2022-08-16 VITALS — BP 116/80 | HR 73 | Temp 98.1°F | Ht 68.0 in | Wt 234.0 lb

## 2022-08-16 DIAGNOSIS — R632 Polyphagia: Secondary | ICD-10-CM

## 2022-08-16 DIAGNOSIS — Z6835 Body mass index (BMI) 35.0-35.9, adult: Secondary | ICD-10-CM

## 2022-08-16 DIAGNOSIS — E559 Vitamin D deficiency, unspecified: Secondary | ICD-10-CM | POA: Diagnosis not present

## 2022-08-16 MED ORDER — WEGOVY 0.5 MG/0.5ML ~~LOC~~ SOAJ
0.5000 mg | SUBCUTANEOUS | 0 refills | Status: DC
  Filled 2022-08-16 – 2022-08-25 (×2): qty 2, 28d supply, fill #0

## 2022-08-16 NOTE — Assessment & Plan Note (Signed)
Last vitamin D Lab Results  Component Value Date   VD25OH 34.3 03/14/2022   Taking prescription vitamin D 50,000 IU once weekly. Energy level is improving Discontinue prescription vitamin D and change over to over-the-counter vitamin D3 2000 IU once daily  Recheck level in 2 months

## 2022-08-16 NOTE — Progress Notes (Signed)
Office: [phone]  /  Fax: [phone]  WEIGHT SUMMARY AND BIOMETRICS  Vitals Temp: 98.1 F (36.7 C) BP: 116/80 Pulse Rate: 73 SpO2: 97 %   Anthropometric Measurements Height: '5\' 8"'$  (1.727 m) Weight: 234 lb (106.1 kg) BMI (Calculated): 35.59 Weight at Last Visit: 240lb Weight Lost Since Last Visit: 6lb Starting Weight: 246lb Total Weight Loss (lbs): 12 lb (5.443 kg)   Body Composition  Body Fat %: 32.7 % Fat Mass (lbs): 76.8 lbs Muscle Mass (lbs): 150.2 lbs Total Body Water (lbs): 110.2 lbs Visceral Fat Rating : 16   Other Clinical Data Fasting: no Labs: no Today's Visit #: 7 Starting Date: 03/14/22     HPI  Chief Complaint: OBESITY  Stephen Jenkins is here to discuss his progress with his obesity treatment plan. He is on the the Category 3 Plan and states he is following his eating plan approximately 80 % of the time. He states he is exercising 20-30 minutes 2-3 times per week.   Interval History:  Since last office visit he is down 6 lb Appetite has improved on Wegovy- completed 4th injection of 0.25 mg Denies GI side effects Started walking outside more Doing better food choices Stress at work remains high  Down 1.2 lb of muscle mass, down 5.8 lb of body fat  Pharmacotherapy: Wegovy 0.25 weekly  PHYSICAL EXAM:  Blood pressure 116/80, pulse 73, temperature 98.1 F (36.7 C), height '5\' 8"'$  (1.727 m), weight 234 lb (106.1 kg), SpO2 97 %. Body mass index is 35.58 kg/m.  General: He is overweight, cooperative, alert, well developed, and in no acute distress. PSYCH: Has normal mood, affect and thought process.   Lungs: Normal breathing effort, no conversational dyspnea.   ASSESSMENT AND PLAN  TREATMENT PLAN FOR OBESITY:  Recommended Dietary Goals  Stephen Jenkins is currently in the action stage of change. As such, his goal is to continue weight management plan. He has agreed to the Category 3 Plan.  Behavioral Intervention  We discussed the  following Behavioral Modification Strategies today: increasing lean protein intake, increasing vegetables, avoiding skipping meals, increasing water intake, and work on meal planning and easy cooking plans.  Additional resources provided today: NA  Recommended Physical Activity Goals  Stephen Jenkins has been advised to work up to 150 minutes of moderate intensity aerobic activity a week and strengthening exercises 2-3 times per week for cardiovascular health, weight loss maintenance and preservation of muscle mass.   He has agreed to Patient also encouraged on scheduling and tracking physical activity.   Pharmacotherapy changes for the treatment of obesity: increase Wegovy to 0.5 mg once weekly injection  ASSOCIATED CONDITIONS ADDRESSED TODAY  Vitamin D deficiency Assessment & Plan: Last vitamin D Lab Results  Component Value Date   VD25OH 34.3 03/14/2022   Taking prescription vitamin D 50,000 IU once weekly. Energy level is improving Discontinue prescription vitamin D and change over to over-the-counter vitamin D3 2000 IU once daily  Recheck level in 2 months   Morbid obesity ([place])  BMI 35.0-35.9,adult  Polyphagia Assessment & Plan: Improving  He is seeing good benefit from [facility] 0.25 mg once weekly injection with volume control at mealtime and is making better food choices.  He denies GI upset.  Will increase Wegovy to 0.5 mg once weekly injection for 4 weeks.  Continue to work on prescribed meal plan with adequate lean protein and fiber at mealtime  Orders: [id number]; Inject 0.5 mg into the skin once a week.  Dispense:  2 mL; Refill: 0      He was informed of the importance of frequent follow up visits to maximize his success with intensive lifestyle modifications for his multiple health conditions.   ATTESTASTION STATEMENTS:  Reviewed by clinician on day of visit: allergies, medications, problem list, medical history, surgical history, family history, social  history, and previous encounter notes pertinent to obesity diagnosis.   I have personally spent 30 minutes total time today in preparation, patient care, nutritional counseling and documentation for this visit, including the following: review of clinical lab tests; review of medical tests/procedures/services.      Dell Ponto, DO DABFM, DABOM [facility] [address] [place], [place] [address] [phone]

## 2022-08-16 NOTE — Assessment & Plan Note (Signed)
Improving  He is seeing good benefit from [facility] 0.25 mg once weekly injection with volume control at mealtime and is making better food choices.  He denies GI upset.  Will increase Wegovy to 0.5 mg once weekly injection for 4 weeks.  Continue to work on prescribed meal plan with adequate lean protein and fiber at mealtime

## 2022-08-18 ENCOUNTER — Other Ambulatory Visit (HOSPITAL_BASED_OUTPATIENT_CLINIC_OR_DEPARTMENT_OTHER): Payer: Self-pay

## 2022-08-19 ENCOUNTER — Other Ambulatory Visit (HOSPITAL_BASED_OUTPATIENT_CLINIC_OR_DEPARTMENT_OTHER): Payer: Self-pay

## 2022-08-22 ENCOUNTER — Telehealth (INDEPENDENT_AMBULATORY_CARE_PROVIDER_SITE_OTHER): Payer: Self-pay | Admitting: *Deleted

## 2022-08-22 NOTE — Telephone Encounter (Signed)
Phone call to [facility] regarding patients Wegovy medication. They do need a new Prior authorization for his next dose. She is going to send over the request and if needed she will fax a form to fill out. Patient notified.

## 2022-08-24 ENCOUNTER — Telehealth (INDEPENDENT_AMBULATORY_CARE_PROVIDER_SITE_OTHER): Payer: Self-pay | Admitting: Family Medicine

## 2022-08-24 NOTE — Telephone Encounter (Signed)
Prior authorization faxed to [facility]. [phone]. For patients Wegovy.

## 2022-08-25 ENCOUNTER — Other Ambulatory Visit: Payer: Self-pay

## 2022-08-25 ENCOUNTER — Other Ambulatory Visit (HOSPITAL_BASED_OUTPATIENT_CLINIC_OR_DEPARTMENT_OTHER): Payer: Self-pay

## 2022-08-25 ENCOUNTER — Other Ambulatory Visit (HOSPITAL_COMMUNITY): Payer: Self-pay

## 2022-08-25 NOTE — Telephone Encounter (Signed)
Prior authorization approved for patients Wegovy. Patient notified.

## 2022-09-15 ENCOUNTER — Other Ambulatory Visit (HOSPITAL_BASED_OUTPATIENT_CLINIC_OR_DEPARTMENT_OTHER): Payer: Self-pay

## 2022-09-15 ENCOUNTER — Other Ambulatory Visit: Payer: Self-pay

## 2022-09-15 ENCOUNTER — Encounter (INDEPENDENT_AMBULATORY_CARE_PROVIDER_SITE_OTHER): Payer: Self-pay | Admitting: Family Medicine

## 2022-09-15 ENCOUNTER — Ambulatory Visit (INDEPENDENT_AMBULATORY_CARE_PROVIDER_SITE_OTHER): Payer: BC Managed Care – PPO | Admitting: Family Medicine

## 2022-09-15 VITALS — BP 117/82 | HR 78 | Temp 98.5°F | Ht 68.0 in | Wt 238.0 lb

## 2022-09-15 DIAGNOSIS — Z6836 Body mass index (BMI) 36.0-36.9, adult: Secondary | ICD-10-CM | POA: Diagnosis not present

## 2022-09-15 DIAGNOSIS — E559 Vitamin D deficiency, unspecified: Secondary | ICD-10-CM | POA: Diagnosis not present

## 2022-09-15 DIAGNOSIS — R632 Polyphagia: Secondary | ICD-10-CM

## 2022-09-15 DIAGNOSIS — Z566 Other physical and mental strain related to work: Secondary | ICD-10-CM

## 2022-09-15 MED ORDER — WEGOVY 0.5 MG/0.5ML ~~LOC~~ SOAJ
0.5000 mg | SUBCUTANEOUS | 1 refills | Status: DC
  Filled 2022-09-15 (×2): qty 2, 28d supply, fill #0
  Filled 2022-10-21: qty 2, 28d supply, fill #1

## 2022-09-15 NOTE — Assessment & Plan Note (Signed)
Polyphasia has improved on Wegovy 0.5 mg once weekly injection.  He has adequate satiety with occasional bouts of nausea and acid reflux.  He has been learning to listen to his fullness cues and avoid foods that are excessively greasy or sweet.  He is doing a better job of eating on a schedule including lean protein and fiber with meals.  Meal preparation has reduced due to working overtime.  Continue to work on meal planning, eating grab and go options for fiber and lean protein with meals and snacks.  Continue Wegovy at 0.5 mg once weekly injection.  Did not increase his dose due to fear of skipping meals.

## 2022-09-15 NOTE — Progress Notes (Signed)
Office: [phone]  /  Fax: [phone]  WEIGHT SUMMARY AND BIOMETRICS  Starting Date: 03/14/22  Starting Weight: 246lb   Weight Lost Since Last Visit: 0   Vitals Temp: 98.5 F (36.9 C) BP: 117/82 Pulse Rate: 78 SpO2: 98 %   Body Composition  Body Fat %: 33.4 % Fat Mass (lbs): 79.6 lbs Muscle Mass (lbs): 151.2 lbs Total Body Water (lbs): 113.4 lbs Visceral Fat Rating : 17   HPI  Chief Complaint: OBESITY  Stephen Jenkins is here to discuss his progress with his obesity treatment plan. He is on the the Category 3 Plan and states he is following his eating plan approximately 85 % of the time. He states he is exercising 30 minutes 2-3 times per week.   Interval History:  Since last office visit he is is up 4 lb He has been working more hours He struggles to get in enough water and fiber causing more constipation He is looking at houses and plans to move in June He will be doing lots of yardwork in the new house He denies large portion sizes He is on Wegovy 0.5 mg weekly- he has occasional nausea and heartburn He is averaging ~8,000 steps on days that he is working in the [employer]  Pharmacotherapy: Wegovy 0.5 mg weekly  PHYSICAL EXAM:  Blood pressure 117/82, pulse 78, temperature 98.5 F (36.9 C), height 5\' 8"  (1.727 m), weight 238 lb (108 kg), SpO2 98 %. Body mass index is 36.19 kg/m.  General: He is overweight, cooperative, alert, well developed, and in no acute distress. PSYCH: Has normal mood, affect and thought process.   Lungs: Normal breathing effort, no conversational dyspnea.   ASSESSMENT AND PLAN  TREATMENT PLAN FOR OBESITY:  Recommended Dietary Goals  Stephen Jenkins is currently in the action stage of change. As such, his goal is to continue weight management plan. He has agreed to the Category 3 Plan.  Behavioral Intervention  We discussed the following Behavioral Modification Strategies today: increasing lean protein intake, increasing vegetables,  increasing fiber rich foods, avoiding skipping meals, increasing water intake, work on meal planning and preparation, emotional eating strategies and understanding the difference between hunger signals and cravings, and avoiding temptations and identifying enticing environmental cues.  Additional resources provided today: NA  Recommended Physical Activity Goals  Stephen Jenkins has been advised to work up to 150 minutes of moderate intensity aerobic activity a week and strengthening exercises 2-3 times per week for cardiovascular health, weight loss maintenance and preservation of muscle mass.   He has agreed to Think about ways to increase physical activity yardwork, tracking daily steps, walking dogs  Pharmacotherapy changes for the treatment of obesity:  Wegovy 0.5 mg weekly  ASSOCIATED CONDITIONS ADDRESSED TODAY  Vitamin D deficiency Assessment & Plan: Last vitamin D Lab Results  Component Value Date   VD25OH 34.3 03/14/2022   He did change his prescription vitamin D 50,000 IU once weekly over to over-the-counter vitamin D but has been inconsistent with taking it.  His energy level remains fair.  Reminded patient to take over-the-counter vitamin D3 4000 IU once daily.  Recheck level in June.   Polyphagia Assessment & Plan: Polyphasia has improved on Wegovy 0.5 mg once weekly injection.  He has adequate satiety with occasional bouts of nausea and acid reflux.  He has been learning to listen to his fullness cues and avoid foods that are excessively greasy or sweet.  He is doing a better job of eating on a schedule including lean  protein and fiber with meals.  Meal preparation has reduced due to working overtime.  Continue to work on meal planning, eating grab and go options for fiber and lean protein with meals and snacks.  Continue Wegovy at 0.5 mg once weekly injection.  Did not increase his dose due to fear of skipping meals.  Orders: -     Wegovy; Inject 0.5 mg into the skin once a  week.  Dispense: 2 mL; Refill: 1  Morbid obesity Assessment & Plan: Reviewed his overall progress of 8 pounds of weight loss since starting our program 7 months ago.  Reviewed bioimpedance results.  He is still motivated to continue working on medically supervised weight management.  He has had difficulty adding in regular exercise due to working long hours.  Encouraged him to track his daily steps using a smart watch with a goal of 10,000 steps a day, adding in resistance training 15 minutes twice a week.  Continue Wegovy 0.5 mg once weekly injection.  Continue working on prescribed dietary changes.   BMI 36.0-36.9,adult  Stress at work Assessment & Plan: Stress at work has been stable.  He is working 16 hours a day about 6 days a week and preparing to move to a new house in June.  His wife has been supportive.  He is not considering reducing his jobs to 1.  Will continue work on mindfulness, stress reduction and eating on a schedule.       He was informed of the importance of frequent follow up visits to maximize his success with intensive lifestyle modifications for his multiple health conditions.   ATTESTASTION STATEMENTS:  Reviewed by clinician on day of visit: allergies, medications, problem list, medical history, surgical history, family history, social history, and previous encounter notes pertinent to obesity diagnosis.   I have personally spent 30 minutes total time today in preparation, patient care, nutritional counseling and documentation for this visit, including the following: review of clinical lab tests; review of medical tests/procedures/services.      [name], DO DABFM, DABOM [facility] [address] [place], [place] [address] [phone]

## 2022-09-15 NOTE — Assessment & Plan Note (Signed)
Reviewed his overall progress of 8 pounds of weight loss since starting our program 7 months ago.  Reviewed bioimpedance results.  He is still motivated to continue working on medically supervised weight management.  He has had difficulty adding in regular exercise due to working long hours.  Encouraged him to track his daily steps using a smart watch with a goal of 10,000 steps a day, adding in resistance training 15 minutes twice a week.  Continue Wegovy 0.5 mg once weekly injection.  Continue working on prescribed dietary changes.

## 2022-09-15 NOTE — Assessment & Plan Note (Signed)
Stress at work has been stable.  He is working 16 hours a day about 6 days a week and preparing to move to a new house in June.  His wife has been supportive.  He is not considering reducing his jobs to 1.  Will continue work on mindfulness, stress reduction and eating on a schedule.

## 2022-09-15 NOTE — Assessment & Plan Note (Signed)
Last vitamin D Lab Results  Component Value Date   VD25OH 34.3 03/14/2022   He did change his prescription vitamin D 50,000 IU once weekly over to over-the-counter vitamin D but has been inconsistent with taking it.  His energy level remains fair.  Reminded patient to take over-the-counter vitamin D3 4000 IU once daily.  Recheck level in June.

## 2022-10-21 ENCOUNTER — Other Ambulatory Visit (HOSPITAL_BASED_OUTPATIENT_CLINIC_OR_DEPARTMENT_OTHER): Payer: Self-pay

## 2022-10-25 ENCOUNTER — Other Ambulatory Visit (HOSPITAL_BASED_OUTPATIENT_CLINIC_OR_DEPARTMENT_OTHER): Payer: Self-pay

## 2022-10-25 ENCOUNTER — Encounter (INDEPENDENT_AMBULATORY_CARE_PROVIDER_SITE_OTHER): Payer: Self-pay | Admitting: Family Medicine

## 2022-10-25 ENCOUNTER — Ambulatory Visit (INDEPENDENT_AMBULATORY_CARE_PROVIDER_SITE_OTHER): Payer: BC Managed Care – PPO | Admitting: Family Medicine

## 2022-10-25 VITALS — BP 109/70 | HR 71 | Temp 98.3°F | Ht 68.0 in | Wt 240.0 lb

## 2022-10-25 DIAGNOSIS — R632 Polyphagia: Secondary | ICD-10-CM | POA: Diagnosis not present

## 2022-10-25 DIAGNOSIS — Z6836 Body mass index (BMI) 36.0-36.9, adult: Secondary | ICD-10-CM

## 2022-10-25 DIAGNOSIS — E559 Vitamin D deficiency, unspecified: Secondary | ICD-10-CM

## 2022-10-25 MED ORDER — WEGOVY 1 MG/0.5ML ~~LOC~~ SOAJ
1.0000 mg | SUBCUTANEOUS | 0 refills | Status: DC
  Filled 2022-10-25: qty 2, fill #0
  Filled 2022-11-21: qty 2, 28d supply, fill #0

## 2022-10-25 NOTE — Assessment & Plan Note (Signed)
Improving on Wegovy without adverse side effect He has been on 0.5 mg dose for 6 weeks and had a 1 week pause during his cruise He is enjoying the added satiety  Increase Wegovy to 1 mg once weekly injection

## 2022-10-25 NOTE — Assessment & Plan Note (Signed)
Reviewed bioimpedance results.  He is maintaining his muscle mass and was able to maintain his body fat after a 1 week cruise.  He remains mindful of his food choices, portion sizes and is working on eating on a schedule despite long work hours.  He has seen a total weight loss of 6 pounds in the past 7 months.  He is enjoying added satiety from [facility] without adverse side effects. 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  Increase Wegovy to 1 mg once weekly injection

## 2022-10-25 NOTE — Assessment & Plan Note (Signed)
Last vitamin D Lab Results  Component Value Date   VD25OH 34.3 03/14/2022   He has been taking over-the-counter vitamin D3 5000 IU once daily Energy levels have improved  Plan to recheck vitamin D level in the fall

## 2022-10-25 NOTE — Progress Notes (Signed)
Office: [phone]  /  Fax: [phone]  WEIGHT SUMMARY AND BIOMETRICS  Starting Date: 03/14/22  Starting Weight: 246lb   Weight Lost Since Last Visit: 0lb   Vitals Temp: 98.3 F (36.8 C) BP: 109/70 Pulse Rate: 71 SpO2: 98 %   Body Composition  Body Fat %: 33.2 % Fat Mass (lbs): 79.6 lbs Muscle Mass (lbs): 152.4 lbs Total Body Water (lbs): 112.2 lbs Visceral Fat Rating : 17     HPI  Chief Complaint: OBESITY  Hyde is here to discuss his progress with his obesity treatment plan. He is on the the Category 3 Plan and states he is following his eating plan approximately 20 % of the time. He states he is exercising 30 minutes 2-3 times per week.   Interval History:  Since last office visit he is up 2 lb He is down 6  in the past 7 mos He went on a cruise for a week He did pause his Wegovy for a week and is back on it He is up 1.2 lb of muscle mass and is down 0 lb of body fat He is moving to a new house in June His son is graduating HS soon He tends to skip meals when busy at work He has a bagel 2 x a week  Pharmacotherapy: Wegovy 0.5 mg weekly  PHYSICAL EXAM:  Blood pressure 109/70, pulse 71, temperature 98.3 F (36.8 C), height 5\' 8"  (1.727 m), weight 240 lb (108.9 kg), SpO2 98 %. Body mass index is 36.49 kg/m.  General: He is overweight, cooperative, alert, well developed, and in no acute distress. PSYCH: Has normal mood, affect and thought process.   Lungs: Normal breathing effort, no conversational dyspnea.   ASSESSMENT AND PLAN  TREATMENT PLAN FOR OBESITY:  Recommended Dietary Goals  Andy is currently in the action stage of change. As such, his goal is to continue weight management plan. He has agreed to practicing portion control and making smarter food choices, such as increasing vegetables and decreasing simple carbohydrates.  Behavioral Intervention  We discussed the following Behavioral Modification Strategies today: increasing  lean protein intake, decreasing simple carbohydrates , increasing vegetables, increasing lower glycemic fruits, avoiding skipping meals, increasing water intake, work on meal planning and preparation, keeping healthy foods at home, continue to practice mindfulness when eating, and planning for success.  Additional resources provided today: NA  Recommended Physical Activity Goals  Elysha has been advised to work up to 150 minutes of moderate intensity aerobic activity a week and strengthening exercises 2-3 times per week for cardiovascular health, weight loss maintenance and preservation of muscle mass.   He has agreed to [employer] strengthening exercises with a goal of 2-3 sessions a week   Pharmacotherapy changes for the treatment of obesity: Increase Wegovy to 1 mg once weekly injection  ASSOCIATED CONDITIONS ADDRESSED TODAY  Polyphagia Assessment & Plan: Improving on Wegovy without adverse side effect He has been on 0.5 mg dose for 6 weeks and had a 1 week pause during his cruise He is enjoying the added satiety  Increase Wegovy to 1 mg once weekly injection  Orders: -     GNFAOZ; Inject 1 mg into the skin once a week.  Dispense: 2 mL; Refill: 0  Morbid obesity ([facility]) with starting BMI 37 Assessment & Plan: Reviewed bioimpedance results.  He is maintaining his muscle mass and was able to maintain his body fat after a 1 week cruise.  He remains mindful of his food choices, portion  sizes and is working on eating on a schedule despite long work hours.  He has seen a total weight loss of 6 pounds in the past 7 months.  He is enjoying added satiety from [facility] without adverse side effects. 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  Increase Wegovy to 1 mg once weekly injection   BMI 36.0-36.9,adult  Vitamin D deficiency Assessment & Plan: Last vitamin D Lab Results  Component Value Date   VD25OH  34.3 03/14/2022   He has been taking over-the-counter vitamin D3 5000 IU once daily Energy levels have improved  Plan to recheck vitamin D level in the fall       He was informed of the importance of frequent follow up visits to maximize his success with intensive lifestyle modifications for his multiple health conditions.   ATTESTASTION STATEMENTS:  Reviewed by clinician on day of visit: allergies, medications, problem list, medical history, surgical history, family history, social history, and previous encounter notes pertinent to obesity diagnosis.   I have personally spent 30 minutes total time today in preparation, patient care, nutritional counseling and documentation for this visit, including the following: review of clinical lab tests; review of medical tests/procedures/services.      [name], DO DABFM, DABOM [facility] [address] [place], [place] [address] [phone]

## 2022-11-14 ENCOUNTER — Other Ambulatory Visit (HOSPITAL_BASED_OUTPATIENT_CLINIC_OR_DEPARTMENT_OTHER): Payer: Self-pay

## 2022-11-14 DIAGNOSIS — M62830 Muscle spasm of back: Secondary | ICD-10-CM | POA: Diagnosis not present

## 2022-11-14 DIAGNOSIS — M5430 Sciatica, unspecified side: Secondary | ICD-10-CM | POA: Diagnosis not present

## 2022-11-21 ENCOUNTER — Other Ambulatory Visit (HOSPITAL_BASED_OUTPATIENT_CLINIC_OR_DEPARTMENT_OTHER): Payer: Self-pay

## 2022-11-23 ENCOUNTER — Other Ambulatory Visit (HOSPITAL_BASED_OUTPATIENT_CLINIC_OR_DEPARTMENT_OTHER): Payer: Self-pay

## 2022-11-24 ENCOUNTER — Other Ambulatory Visit (HOSPITAL_BASED_OUTPATIENT_CLINIC_OR_DEPARTMENT_OTHER): Payer: Self-pay

## 2022-11-29 ENCOUNTER — Other Ambulatory Visit (HOSPITAL_BASED_OUTPATIENT_CLINIC_OR_DEPARTMENT_OTHER): Payer: Self-pay

## 2022-11-29 ENCOUNTER — Encounter (INDEPENDENT_AMBULATORY_CARE_PROVIDER_SITE_OTHER): Payer: Self-pay | Admitting: Family Medicine

## 2022-11-29 ENCOUNTER — Ambulatory Visit (INDEPENDENT_AMBULATORY_CARE_PROVIDER_SITE_OTHER): Payer: BC Managed Care – PPO | Admitting: Family Medicine

## 2022-11-29 VITALS — BP 118/80 | HR 85 | Temp 97.5°F | Ht 68.0 in | Wt 229.0 lb

## 2022-11-29 DIAGNOSIS — Z6834 Body mass index (BMI) 34.0-34.9, adult: Secondary | ICD-10-CM

## 2022-11-29 DIAGNOSIS — M519 Unspecified thoracic, thoracolumbar and lumbosacral intervertebral disc disorder: Secondary | ICD-10-CM | POA: Diagnosis not present

## 2022-11-29 DIAGNOSIS — R632 Polyphagia: Secondary | ICD-10-CM | POA: Diagnosis not present

## 2022-11-29 MED ORDER — WEGOVY 1 MG/0.5ML ~~LOC~~ SOAJ
1.0000 mg | SUBCUTANEOUS | 0 refills | Status: DC
  Filled 2022-11-29 – 2022-12-21 (×2): qty 2, 28d supply, fill #0

## 2022-11-29 NOTE — Assessment & Plan Note (Signed)
Doing well, down 17 lb in 8 mos since first visit and down 12 lb in 5 mos since starting [facility] in Jan. Doing well with calorie restricted diet focused on lean protein and fiber with meals He has been more physically active as well and is motivated to get towards a 195 lb weight goal

## 2022-11-29 NOTE — Assessment & Plan Note (Signed)
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  Low back pain has improved but still mild in nature.  He is avoiding heavy lifting, pushing or pulling and is scheduled for follow up.  Continue to work on weight reduction Avoid heavy lifting Walking , swimming OK for cardio Plan to add in core strengthening exercises/ PT if referred

## 2022-11-29 NOTE — Assessment & Plan Note (Signed)
Improving with ramping dose of Wegovy, currently one week on Wegovy 1 mg weekly injection dose and doing well with reduced food volumes and control over cravings.  Focusing on eating schedule, lean protein and fiber with meals  Continue Wegovy 1 mg weekly Aim for 110 g of protein intake daily Increase water intake to 100 oz/ day

## 2022-11-29 NOTE — Progress Notes (Signed)
Office: [phone]  /  Fax: [phone]  WEIGHT SUMMARY AND BIOMETRICS  Starting Date: 03/14/22  Starting Weight: 246lb   Weight Lost Since Last Visit: 11lb   Vitals Temp: (!) 97.5 F (36.4 C) BP: 118/80 Pulse Rate: 85 SpO2: 96 %   Body Composition  Body Fat %: 31.4 % Fat Mass (lbs): 72 lbs Muscle Mass (lbs): 149.6 lbs Total Body Water (lbs): 109.6 lbs Visceral Fat Rating : 15     HPI  Chief Complaint: OBESITY  Stephen Jenkins is here to discuss his progress with his obesity treatment plan. He is on the the Category 3 Plan and states he is following his eating plan approximately 85-90 % of the time. He states he is walking a couple days a week.    Interval History:  Since last office visit he is down 11 lb in the past month He is down 2.8 lb of muscle mass and down 7.6 lb of body fat in the past month He did go up a week ago to [facility] 1 mg weekly He had occasional heartburn from pizza, sauce, pepperoni He has been active moving to a new house.  He hurt his back and has been able to walk some but not weight lift He has been on a round of prednisone for LBP due to L1-2 disc dz He has improved satiety with Wegovy without nausea He has lost 17 lb in 8 mos which is a 6.9 % TBW loss He is down 12 lb in 5 mos since starting Wegovy  Pharmacotherapy: Wegovy 1 mg weekly  PHYSICAL EXAM:  Blood pressure 118/80, pulse 85, temperature (!) 97.5 F (36.4 C), height 5\' 8"  (1.727 m), weight 229 lb (103.9 kg), SpO2 96 %. Body mass index is 34.82 kg/m.  General: He is overweight, cooperative, alert, well developed, and in no acute distress. PSYCH: Has normal mood, affect and thought process.   Lungs: Normal breathing effort, no conversational dyspnea.   ASSESSMENT AND PLAN  TREATMENT PLAN FOR OBESITY:  Recommended Dietary Goals  Stephen Jenkins is currently in the action stage of change. As such, his goal is to continue weight management plan. He has agreed to the Category 3  Plan.  Behavioral Intervention  We discussed the following Behavioral Modification Strategies today: increasing lean protein intake, decreasing simple carbohydrates , increasing vegetables, increasing lower glycemic fruits, avoiding skipping meals, increasing water intake, continue to practice mindfulness when eating, planning for success, and Increase water intake .  Additional resources provided today: NA  Recommended Physical Activity Goals  Stephen Jenkins has been advised to work up to 150 minutes of moderate intensity aerobic activity a week and strengthening exercises 2-3 times per week for cardiovascular health, weight loss maintenance and preservation of muscle mass.   He has agreed to [employer] strengthening exercises with a goal of 2-3 sessions a week  - core strengthening exercise, follow up with ortho for Lumbar disc dz  Pharmacotherapy changes for the treatment of obesity: none  ASSOCIATED CONDITIONS ADDRESSED TODAY  Polyphagia Assessment & Plan: Improving with ramping dose of Wegovy, currently one week on Wegovy 1 mg weekly injection dose and doing well with reduced food volumes and control over cravings.  Focusing on eating schedule, lean protein and fiber with meals  Continue Wegovy 1 mg weekly Aim for 110 g of protein intake daily Increase water intake to 100 oz/ day  Orders: -     MVHQIO; Inject 1 mg into the skin once a week.  Dispense: 2 mL; Refill:  0  Morbid obesity (HCC) with starting BMI 37 Assessment & Plan: Doing well, down 17 lb in 8 mos since first visit and down 12 lb in 5 mos since starting [facility] in Jan. Doing well with calorie restricted diet focused on lean protein and fiber with meals He has been more physically active as well and is motivated to get towards a 195 lb weight goal     BMI 34.0-34.9,adult  Lumbar disc disease Assessment & Plan: He reports L1-2 disc disease found on xray with an acute flare up between visits requiring muscle relaxers and a  round of predisone.  Low back pain has improved but still mild in nature.  He is avoiding heavy lifting, pushing or pulling and is scheduled for follow up.  Continue to work on weight reduction Avoid heavy lifting Walking , swimming OK for cardio Plan to add in core strengthening exercises/ PT if referred       He was informed of the importance of frequent follow up visits to maximize his success with intensive lifestyle modifications for his multiple health conditions.   ATTESTASTION STATEMENTS:  Reviewed by clinician on day of visit: allergies, medications, problem list, medical history, surgical history, family history, social history, and previous encounter notes pertinent to obesity diagnosis.   I have personally spent 30 minutes total time today in preparation, patient care, nutritional counseling and documentation for this visit, including the following: review of clinical lab tests; review of medical tests/procedures/services.      Stephen Brink, DO DABFM, DABOM [facility] [address] [place], [place] [address] [phone]

## 2022-12-21 ENCOUNTER — Other Ambulatory Visit (HOSPITAL_BASED_OUTPATIENT_CLINIC_OR_DEPARTMENT_OTHER): Payer: Self-pay

## 2022-12-22 DIAGNOSIS — M545 Low back pain, unspecified: Secondary | ICD-10-CM | POA: Diagnosis not present

## 2022-12-22 DIAGNOSIS — R2689 Other abnormalities of gait and mobility: Secondary | ICD-10-CM | POA: Diagnosis not present

## 2022-12-22 DIAGNOSIS — M5441 Lumbago with sciatica, right side: Secondary | ICD-10-CM | POA: Diagnosis not present

## 2022-12-22 DIAGNOSIS — M79604 Pain in right leg: Secondary | ICD-10-CM | POA: Diagnosis not present

## 2022-12-28 DIAGNOSIS — R2689 Other abnormalities of gait and mobility: Secondary | ICD-10-CM | POA: Diagnosis not present

## 2022-12-28 DIAGNOSIS — M545 Low back pain, unspecified: Secondary | ICD-10-CM | POA: Diagnosis not present

## 2022-12-28 DIAGNOSIS — M79604 Pain in right leg: Secondary | ICD-10-CM | POA: Diagnosis not present

## 2023-01-03 ENCOUNTER — Other Ambulatory Visit (HOSPITAL_BASED_OUTPATIENT_CLINIC_OR_DEPARTMENT_OTHER): Payer: Self-pay

## 2023-01-03 ENCOUNTER — Ambulatory Visit (INDEPENDENT_AMBULATORY_CARE_PROVIDER_SITE_OTHER): Payer: BC Managed Care – PPO | Admitting: Family Medicine

## 2023-01-03 ENCOUNTER — Encounter (INDEPENDENT_AMBULATORY_CARE_PROVIDER_SITE_OTHER): Payer: Self-pay | Admitting: Family Medicine

## 2023-01-03 ENCOUNTER — Encounter (HOSPITAL_BASED_OUTPATIENT_CLINIC_OR_DEPARTMENT_OTHER): Payer: Self-pay

## 2023-01-03 VITALS — BP 123/82 | HR 97 | Temp 98.0°F | Ht 68.0 in | Wt 223.0 lb

## 2023-01-03 DIAGNOSIS — E559 Vitamin D deficiency, unspecified: Secondary | ICD-10-CM | POA: Diagnosis not present

## 2023-01-03 DIAGNOSIS — Z6833 Body mass index (BMI) 33.0-33.9, adult: Secondary | ICD-10-CM | POA: Diagnosis not present

## 2023-01-03 DIAGNOSIS — R632 Polyphagia: Secondary | ICD-10-CM

## 2023-01-03 MED ORDER — WEGOVY 1.7 MG/0.75ML ~~LOC~~ SOAJ
1.7000 mg | SUBCUTANEOUS | 0 refills | Status: DC
Start: 2023-01-03 — End: 2023-02-08
  Filled 2023-01-03 – 2023-01-10 (×2): qty 3, 28d supply, fill #0

## 2023-01-03 NOTE — Assessment & Plan Note (Signed)
Reviewed patient's 9 mos results on bioimpedence He has lost 28% of his total body weight loss in muscle loss His TBW loss has been 9.3% in the past 9 mos 2 flare ups of radicular low back pain requiring steroids has limited his ability to ramp up weight training this summer He will complete PT and continue home PT exercises  Continue current dietary plan

## 2023-01-03 NOTE — Progress Notes (Signed)
Office: [phone]  /  Fax: [phone]  WEIGHT SUMMARY AND BIOMETRICS  Starting Date: 03/14/22  Starting Weight: 246lb   Weight Lost Since Last Visit: 6lb   Vitals Temp: 98 F (36.7 C) BP: 123/82 Pulse Rate: 97 SpO2: 99 %   Body Composition  Body Fat %: 30.9 % Fat Mass (lbs): 69.2 lbs Muscle Mass (lbs): 147 lbs Total Body Water (lbs): 109.4 lbs Visceral Fat Rating : 15    HPI  Chief Complaint: OBESITY  Branford is here to discuss his progress with his obesity treatment plan. He is on the the Category 3 Plan and states he is following his eating plan approximately 85-90 % of the time. He states he is doing yard work and working on his house.    Interval History:  Since last office visit he is down 6 lb He reports a flare up of Lumbar disc disease with radiculopathy requiring prednisone He has not had an MRI but he is in PT ([facility]) He has not been as active due to pain and numbness in the R leg He is down 2.6 lb of muscle mass and down 2.8 lb of body fat in the past month He has a net weight loss of 23 lb in the past 9 mos of medically supervised weight management His muscle loss has been 28% of his total body weight loss His wife has been supportive  Pharmacotherapy: Wegovy 1 mg weekly  PHYSICAL EXAM:  Blood pressure 123/82, pulse 97, temperature 98 F (36.7 C), height 5\' 8"  (1.727 m), weight 223 lb (101.2 kg), SpO2 99%. Body mass index is 33.91 kg/m.  General: He is overweight, cooperative, alert, well developed, and in no acute distress. PSYCH: Has normal mood, affect and thought process.   Lungs: Normal breathing effort, no conversational dyspnea.   ASSESSMENT AND PLAN  TREATMENT PLAN FOR OBESITY:  Recommended Dietary Goals  Calden is currently in the action stage of change. As such, his goal is to continue weight management plan. He has agreed to the Category 3 Plan.  Behavioral Intervention  We discussed the following Behavioral  Modification Strategies today: increasing lean protein intake, decreasing simple carbohydrates , increasing vegetables, increasing lower glycemic fruits, increasing water intake, keeping healthy foods at home, avoiding temptations and identifying enticing environmental cues, continue to work on implementation of reduced calorie nutritional plan, continue to practice mindfulness when eating, and planning for success.  Additional resources provided today: NA  Recommended Physical Activity Goals  Ephram has been advised to work up to 150 minutes of moderate intensity aerobic activity a week and strengthening exercises 2-3 times per week for cardiovascular health, weight loss maintenance and preservation of muscle mass.   He has agreed to Work on scheduling and tracking physical activity.   Pharmacotherapy changes for the treatment of obesity: Wegovy increased to 1.7 mg weekly  ASSOCIATED CONDITIONS ADDRESSED TODAY  Polyphagia Assessment & Plan: Improving on Wegovy.  He has been on the 1 mg dose for 6 weeks, seeing improved satiety without nausea or meal skipping.  Occasional constipation and burping noted.  Limits sweets and highly acidic foods.  Has been able to reduce portion sizes and is working on making good food choices, prioritizing protein.    He will complete his last 2 pens of Wegovy 1 mg dose, then increase to 1.7 mg weekly injection Aim for 100+ g of protein intake daily Hydrate well with water  Orders: -     VHQION; Inject 1.7 mg into the  skin once a week.  Dispense: 3 mL; Refill: 0  Morbid obesity (HCC) with starting BMI 37 Assessment & Plan: Reviewed patient's 9 mos results on bioimpedence He has lost 28% of his total body weight loss in muscle loss His TBW loss has been 9.3% in the past 9 mos 2 flare ups of radicular low back pain requiring steroids has limited his ability to ramp up weight training this summer He will complete PT and continue home PT  exercises  Continue current dietary plan    Vitamin D deficiency Assessment & Plan: Last vitamin D Lab Results  Component Value Date   VD25OH 34.3 03/14/2022   He is taking OTC Vitamin D 5,000 international units  daily Energy level has improved  Recheck vitamin D level in October    BMI 33.0-33.9,adult      He was informed of the importance of frequent follow up visits to maximize his success with intensive lifestyle modifications for his multiple health conditions.   ATTESTASTION STATEMENTS:  Reviewed by clinician on day of visit: allergies, medications, problem list, medical history, surgical history, family history, social history, and previous encounter notes pertinent to obesity diagnosis.   I have personally spent 30 minutes total time today in preparation, patient care, nutritional counseling and documentation for this visit, including the following: review of clinical lab tests; review of medical tests/procedures/services.      [name], DO DABFM, DABOM [facility] [address] [place], [place] [address] [phone]

## 2023-01-03 NOTE — Assessment & Plan Note (Signed)
Last vitamin D Lab Results  Component Value Date   VD25OH 34.3 03/14/2022   He is taking OTC Vitamin D 5,000 international units  daily Energy level has improved  Recheck vitamin D level in October

## 2023-01-03 NOTE — Assessment & Plan Note (Signed)
Improving on Wegovy.  He has been on the 1 mg dose for 6 weeks, seeing improved satiety without nausea or meal skipping.  Occasional constipation and burping noted.  Limits sweets and highly acidic foods.  Has been able to reduce portion sizes and is working on making good food choices, prioritizing protein.    He will complete his last 2 pens of Wegovy 1 mg dose, then increase to 1.7 mg weekly injection Aim for 100+ g of protein intake daily Hydrate well with water

## 2023-01-04 DIAGNOSIS — M79604 Pain in right leg: Secondary | ICD-10-CM | POA: Diagnosis not present

## 2023-01-04 DIAGNOSIS — M545 Low back pain, unspecified: Secondary | ICD-10-CM | POA: Diagnosis not present

## 2023-01-04 DIAGNOSIS — R2689 Other abnormalities of gait and mobility: Secondary | ICD-10-CM | POA: Diagnosis not present

## 2023-01-05 ENCOUNTER — Other Ambulatory Visit (HOSPITAL_BASED_OUTPATIENT_CLINIC_OR_DEPARTMENT_OTHER): Payer: Self-pay

## 2023-01-09 ENCOUNTER — Other Ambulatory Visit (HOSPITAL_BASED_OUTPATIENT_CLINIC_OR_DEPARTMENT_OTHER): Payer: Self-pay

## 2023-01-09 ENCOUNTER — Telehealth (INDEPENDENT_AMBULATORY_CARE_PROVIDER_SITE_OTHER): Payer: Self-pay | Admitting: Family Medicine

## 2023-01-09 NOTE — Telephone Encounter (Signed)
Phone call to patient insurance company to initiate Prior authorization. They faxed over form to complete. Form was completed and faxed back to them. Waiting on determination for patients Wegovy.

## 2023-01-09 NOTE — Telephone Encounter (Addendum)
Prior authorization approved for patients Wegovy through 01/09/2024. Patient has been notified.

## 2023-01-10 ENCOUNTER — Other Ambulatory Visit (HOSPITAL_BASED_OUTPATIENT_CLINIC_OR_DEPARTMENT_OTHER): Payer: Self-pay

## 2023-01-16 ENCOUNTER — Other Ambulatory Visit (HOSPITAL_BASED_OUTPATIENT_CLINIC_OR_DEPARTMENT_OTHER): Payer: Self-pay

## 2023-02-08 ENCOUNTER — Ambulatory Visit: Payer: BC Managed Care – PPO | Admitting: Family Medicine

## 2023-02-08 ENCOUNTER — Other Ambulatory Visit (HOSPITAL_BASED_OUTPATIENT_CLINIC_OR_DEPARTMENT_OTHER): Payer: Self-pay

## 2023-02-08 ENCOUNTER — Encounter: Payer: Self-pay | Admitting: Family Medicine

## 2023-02-08 VITALS — BP 114/80 | HR 76 | Temp 97.6°F | Ht 68.0 in | Wt 218.0 lb

## 2023-02-08 DIAGNOSIS — Z6833 Body mass index (BMI) 33.0-33.9, adult: Secondary | ICD-10-CM

## 2023-02-08 DIAGNOSIS — R632 Polyphagia: Secondary | ICD-10-CM

## 2023-02-08 DIAGNOSIS — E559 Vitamin D deficiency, unspecified: Secondary | ICD-10-CM | POA: Diagnosis not present

## 2023-02-08 DIAGNOSIS — M519 Unspecified thoracic, thoracolumbar and lumbosacral intervertebral disc disorder: Secondary | ICD-10-CM

## 2023-02-08 MED ORDER — WEGOVY 1.7 MG/0.75ML ~~LOC~~ SOAJ
1.7000 mg | SUBCUTANEOUS | 1 refills | Status: DC
  Filled 2023-02-08: qty 3, 28d supply, fill #0

## 2023-02-08 NOTE — Progress Notes (Signed)
Office: [phone]  /  Fax: [phone]  WEIGHT SUMMARY AND BIOMETRICS  Starting Date: 03/14/22  Starting Weight: 246lb   Weight Lost Since Last Visit: 5lb   Vitals Temp: 97.6 F (36.4 C) BP: 114/80 Pulse Rate: 76 SpO2: 98 %   Body Composition  Body Fat %: 30.2 % Fat Mass (lbs): 65.8 lbs Muscle Mass (lbs): 144.6 lbs Total Body Water (lbs): 106.2 lbs Visceral Fat Rating : 14   HPI  Chief Complaint: OBESITY  Stephen Jenkins is here to discuss his progress with his obesity treatment plan. He is on the the Category 3 Plan and states he is following his eating plan approximately 70 % of the time. He states he is working around the house.    Interval History:  Since last office visit he is down 5 lb This gives him a net weight loss of 28 lb in the past 11 mos This is an 11.3% TBW loss He did increase his Wegovy to 1.7 mg injection 3 weeks ago He has one episode of N/V after increasing his dose.  This was after golfing in the heat and skipping meals He has improved satiety on the 1.7 mg dose without meal skipping He has had a trip to the beach and got off track with foods but was able to limit portion sizes He has been more active with yardwork and has done some golfing He is still busy with working a lot His back pain has improved-- completed PT  Pharmacotherapy: Wegovy 1.7 mg weekly  PHYSICAL EXAM:  Blood pressure 114/80, pulse 76, temperature 97.6 F (36.4 C), height 5\' 8"  (1.727 m), weight 218 lb (98.9 kg), SpO2 98%. Body mass index is 33.15 kg/m.  General: He is overweight, cooperative, alert, well developed, and in no acute distress. PSYCH: Has normal mood, affect and thought process.   Lungs: Normal breathing effort, no conversational dyspnea.   ASSESSMENT AND PLAN  TREATMENT PLAN FOR OBESITY:  Recommended Dietary Goals  [name] is currently in the action stage of change. As such, his goal is to continue weight management plan. He has agreed to the  Category 3 Plan.  Behavioral Intervention  We discussed the following Behavioral Modification Strategies today: increasing lean protein intake, decreasing simple carbohydrates , increasing vegetables, increasing lower glycemic fruits, increasing water intake, work on meal planning and preparation, keeping healthy foods at home, continue to work on implementation of reduced calorie nutritional plan, continue to practice mindfulness when eating, and planning for success.  Additional resources provided today: NA  Recommended Physical Activity Goals  [name] has been advised to work up to 150 minutes of moderate intensity aerobic activity a week and strengthening exercises 2-3 times per week for cardiovascular health, weight loss maintenance and preservation of muscle mass.   He has agreed to Work on scheduling and tracking physical activity.   Pharmacotherapy changes for the treatment of obesity: none  ASSOCIATED CONDITIONS ADDRESSED TODAY  Polyphagia Assessment & Plan: Has improved on Wegovy at 1.7 mg weekly injection with adequate satiety Doing well on Cat 3 meal plan and denied need for increased variety Able to do a fair job maintaining muscle mass.  Aim for 120 g of dietary protein intake daily Increase water intake to 100 oz/ day Continue Wegovy at 1.7 mg weekly injection  Orders: -     MVHQIO; Inject 1.7 mg into the skin once a week.  Dispense: 3 mL; Refill: 1  Morbid obesity (HCC) with starting BMI 37  BMI 33.0-33.9,adult  Vitamin D deficiency Assessment & Plan: Last vitamin D Lab Results  Component Value Date   VD25OH 34.3 03/14/2022   He has changed to OTC vitamin D 5,000 international units  daily Energy level has improved  Repeat vitamin D level next visit   Lumbar disc disease Assessment & Plan: Improving.  He has completed PT and is back to [employer] and [employer] projects. We discussed proper lifting techniques with the option to use a lumbar  belt He has started stretching more  Continue to work on regular exercise, core strengthening exercises and back stretches taught by PT       He was informed of the importance of frequent follow up visits to maximize his success with intensive lifestyle modifications for his multiple health conditions.   ATTESTASTION STATEMENTS:  Reviewed by clinician on day of visit: allergies, medications, problem list, medical history, surgical history, family history, social history, and previous encounter notes pertinent to obesity diagnosis.   I have personally spent 30 minutes total time today in preparation, patient care, nutritional counseling and documentation for this visit, including the following: review of clinical lab tests; review of medical tests/procedures/services.      [name], DO DABFM, DABOM [facility] [address] [place], [place] [address] [phone]

## 2023-02-08 NOTE — Assessment & Plan Note (Signed)
Last vitamin D Lab Results  Component Value Date   VD25OH 34.3 03/14/2022   He has changed to OTC vitamin D 5,000 international units  daily Energy level has improved  Repeat vitamin D level next visit

## 2023-02-08 NOTE — Assessment & Plan Note (Signed)
Has improved on Wegovy at 1.7 mg weekly injection with adequate satiety Doing well on Cat 3 meal plan and denied need for increased variety Able to do a fair job maintaining muscle mass.  Aim for 120 g of dietary protein intake daily Increase water intake to 100 oz/ day Continue Wegovy at 1.7 mg weekly injection

## 2023-02-08 NOTE — Assessment & Plan Note (Signed)
Improving.  He has completed PT and is back to [employer] and [employer] projects. We discussed proper lifting techniques with the option to use a lumbar belt He has started stretching more  Continue to work on regular exercise, core strengthening exercises and back stretches taught by PT

## 2023-02-11 ENCOUNTER — Other Ambulatory Visit (HOSPITAL_BASED_OUTPATIENT_CLINIC_OR_DEPARTMENT_OTHER): Payer: Self-pay

## 2023-03-16 ENCOUNTER — Encounter: Payer: Self-pay | Admitting: Family Medicine

## 2023-03-16 ENCOUNTER — Ambulatory Visit: Payer: BC Managed Care – PPO | Admitting: Family Medicine

## 2023-03-16 ENCOUNTER — Other Ambulatory Visit (HOSPITAL_BASED_OUTPATIENT_CLINIC_OR_DEPARTMENT_OTHER): Payer: Self-pay

## 2023-03-16 VITALS — BP 114/77 | HR 74 | Temp 98.6°F | Ht 68.0 in | Wt 214.0 lb

## 2023-03-16 DIAGNOSIS — Z13220 Encounter for screening for lipoid disorders: Secondary | ICD-10-CM | POA: Diagnosis not present

## 2023-03-16 DIAGNOSIS — Z6832 Body mass index (BMI) 32.0-32.9, adult: Secondary | ICD-10-CM

## 2023-03-16 DIAGNOSIS — R632 Polyphagia: Secondary | ICD-10-CM | POA: Diagnosis not present

## 2023-03-16 DIAGNOSIS — R634 Abnormal weight loss: Secondary | ICD-10-CM

## 2023-03-16 DIAGNOSIS — E559 Vitamin D deficiency, unspecified: Secondary | ICD-10-CM | POA: Diagnosis not present

## 2023-03-16 DIAGNOSIS — E66811 Obesity, class 1: Secondary | ICD-10-CM

## 2023-03-16 DIAGNOSIS — E6609 Other obesity due to excess calories: Secondary | ICD-10-CM

## 2023-03-16 MED ORDER — WEGOVY 1.7 MG/0.75ML ~~LOC~~ SOAJ
1.7000 mg | SUBCUTANEOUS | 1 refills | Status: DC
Start: 2023-03-16 — End: 2023-04-25
  Filled 2023-03-16: qty 3, 28d supply, fill #0
  Filled 2023-04-13: qty 3, 28d supply, fill #1

## 2023-03-16 NOTE — Progress Notes (Signed)
Office: [phone]  /  Fax: [phone]  WEIGHT SUMMARY AND BIOMETRICS  Starting Date: 03/14/22  Starting Weight: 246lb   Weight Lost Since Last Visit: 4lb   Vitals Temp: 98.6 F (37 C) BP: 114/77 Pulse Rate: 74 SpO2: 98 %   Body Composition  Body Fat %: 29.8 % Fat Mass (lbs): 63.8 lbs Muscle Mass (lbs): 143 lbs Total Body Water (lbs): 106.8 lbs Visceral Fat Rating : 14   HPI  Chief Complaint: OBESITY  Stephen Jenkins is here to discuss his progress with his obesity treatment plan. He is on the the Category 3 Plan and states he is following his eating plan approximately 60 % of the time. He states he is exercising 0 minutes 0 times per week.   Interval History:  Since last office visit he is down 4 lb He is down 1.6 lb of muscle mass and down 2 lb of body fat in the past month He has a net weight loss of 32 lb in the past year Of his total body weight lost, 10.6 lb has been muscle mass which is 33% of his total weight lost He has eaten out more but is listening to his full cues He has adequate satiety from [facility] 1.7 mg weekly He has been working on home projects Working 2 jobs continues to limit his time Getting in some fruits and veggies Back pain has started to get worse  Pharmacotherapy: Wegovy 1.7 mg weekly  PHYSICAL EXAM:  Blood pressure 114/77, pulse 74, temperature 98.6 F (37 C), height 5\' 8"  (1.727 m), weight 214 lb (97.1 kg), SpO2 98%. Body mass index is 32.54 kg/m.  General: He is overweight, cooperative, alert, well developed, and in no acute distress. PSYCH: Has normal mood, affect and thought process.   Lungs: Normal breathing effort, no conversational dyspnea.   ASSESSMENT AND PLAN  TREATMENT PLAN FOR OBESITY:  Recommended Dietary Goals  Stephen Jenkins is currently in the action stage of change. As such, his goal is to continue weight management plan. He has agreed to the Category 3 Plan.  Behavioral Intervention  We discussed the  following Behavioral Modification Strategies today: increasing lean protein intake to established goals, increasing vegetables, increasing lower glycemic fruits, increasing fiber rich foods, avoiding skipping meals, increasing water intake , work on meal planning and preparation, keeping healthy foods at home, planning for success, better snacking choices, and continue to work on maintaining a reduced calorie state, getting the recommended amount of protein, incorporating whole foods, making healthy choices, staying well hydrated and practicing mindfulness when eating..  Additional resources provided today: NA  Recommended Physical Activity Goals  Stephen Jenkins has been advised to work up to 150 minutes of moderate intensity aerobic activity a week and strengthening exercises 2-3 times per week for cardiovascular health, weight loss maintenance and preservation of muscle mass.   He has agreed to Increase physical activity in their day and reduce sedentary time (increase NEAT). and Start strengthening exercises with a goal of 2-3 sessions a week   Pharmacotherapy changes for the treatment of obesity: none  ASSOCIATED CONDITIONS ADDRESSED TODAY  Vitamin D deficiency Assessment & Plan: Last vitamin D Lab Results  Component Value Date   VD25OH 34.3 03/14/2022   Doing well on OTC vitamin D 5,000 international units  daily. Energy level is improving.  He notes 'doubling up' on his vitamin D most days.  Repeat lab today   Orders: -     VITAMIN D 25 Hydroxy (Vit-D Deficiency, Fractures)  Polyphagia Assessment & Plan: Improving with adequate satiety on Wegovy 1.7 mg once weekly injection He has improved food volumes without meal skipping Denies abdominal pain, N/V or constipation He is prioritizing lean protein and fiber with meals and has increased water intake  Continue Wegovy at 1.7 mg weekly injection  Orders: -     ZOXWRU; Inject 1.7 mg into the skin once a week.  Dispense: 3 mL;  Refill: 1  Screening, lipid -     Lipid panel  Weight loss -     Comprehensive metabolic panel  Class 1 obesity due to excess calories with body mass index (BMI) of 32.0 to 32.9 in adult, unspecified whether serious comorbidity present      He was informed of the importance of frequent follow up visits to maximize his success with intensive lifestyle modifications for his multiple health conditions.   ATTESTASTION STATEMENTS:  Reviewed by clinician on day of visit: allergies, medications, problem list, medical history, surgical history, family history, social history, and previous encounter notes pertinent to obesity diagnosis.   I have personally spent 30 minutes total time today in preparation, patient care, nutritional counseling and documentation for this visit, including the following: review of clinical lab tests; review of medical tests/procedures/services.      [name], DO DABFM, DABOM [facility] [address] [place], [place] [address] [phone]

## 2023-03-16 NOTE — Assessment & Plan Note (Signed)
Improving with adequate satiety on Wegovy 1.7 mg once weekly injection He has improved food volumes without meal skipping Denies abdominal pain, N/V or constipation He is prioritizing lean protein and fiber with meals and has increased water intake  Continue Wegovy at 1.7 mg weekly injection

## 2023-03-16 NOTE — Assessment & Plan Note (Signed)
Last vitamin D Lab Results  Component Value Date   VD25OH 34.3 03/14/2022   Doing well on OTC vitamin D 5,000 international units  daily. Energy level is improving.  He notes 'doubling up' on his vitamin D most days.  Repeat lab today

## 2023-03-17 LAB — COMPREHENSIVE METABOLIC PANEL
ALT: 16 [IU]/L (ref 0–44)
AST: 14 [IU]/L (ref 0–40)
Albumin: 4.4 g/dL (ref 4.1–5.1)
Alkaline Phosphatase: 57 [IU]/L (ref 44–121)
BUN/Creatinine Ratio: 15 (ref 9–20)
BUN: 16 mg/dL (ref 6–24)
Bilirubin Total: 0.3 mg/dL (ref 0.0–1.2)
CO2: 23 mmol/L (ref 20–29)
Calcium: 9.2 mg/dL (ref 8.7–10.2)
Chloride: 105 mmol/L (ref 96–106)
Creatinine, Ser: 1.06 mg/dL (ref 0.76–1.27)
Globulin, Total: 2.1 g/dL (ref 1.5–4.5)
Glucose: 82 mg/dL (ref 70–99)
Potassium: 5 mmol/L (ref 3.5–5.2)
Sodium: 141 mmol/L (ref 134–144)
Total Protein: 6.5 g/dL (ref 6.0–8.5)
eGFR: 90 mL/min/{1.73_m2} (ref >59)

## 2023-03-17 LAB — LIPID PANEL
Chol/HDL Ratio: 2.8 {ratio} (ref 0.0–5.0)
Cholesterol, Total: 139 mg/dL (ref 100–199)
HDL: 50 mg/dL (ref >39)
LDL Chol Calc (NIH): 78 mg/dL (ref 0–99)
Triglycerides: 51 mg/dL (ref 0–149)
VLDL Cholesterol Cal: 11 mg/dL (ref 5–40)

## 2023-03-17 LAB — VITAMIN D 25 HYDROXY (VIT D DEFICIENCY, FRACTURES): Vit D, 25-Hydroxy: 69.3 ng/mL (ref 30.0–100.0)

## 2023-04-18 ENCOUNTER — Other Ambulatory Visit (HOSPITAL_BASED_OUTPATIENT_CLINIC_OR_DEPARTMENT_OTHER): Payer: Self-pay

## 2023-04-25 ENCOUNTER — Encounter: Payer: Self-pay | Admitting: Family Medicine

## 2023-04-25 ENCOUNTER — Other Ambulatory Visit (HOSPITAL_BASED_OUTPATIENT_CLINIC_OR_DEPARTMENT_OTHER): Payer: Self-pay

## 2023-04-25 ENCOUNTER — Ambulatory Visit: Payer: BC Managed Care – PPO | Admitting: Family Medicine

## 2023-04-25 VITALS — BP 115/81 | HR 88 | Temp 97.7°F | Ht 68.0 in | Wt 212.0 lb

## 2023-04-25 DIAGNOSIS — E559 Vitamin D deficiency, unspecified: Secondary | ICD-10-CM | POA: Diagnosis not present

## 2023-04-25 DIAGNOSIS — R632 Polyphagia: Secondary | ICD-10-CM

## 2023-04-25 DIAGNOSIS — E66811 Obesity, class 1: Secondary | ICD-10-CM | POA: Diagnosis not present

## 2023-04-25 DIAGNOSIS — Z6832 Body mass index (BMI) 32.0-32.9, adult: Secondary | ICD-10-CM

## 2023-04-25 MED ORDER — WEGOVY 1.7 MG/0.75ML ~~LOC~~ SOAJ
1.7000 mg | SUBCUTANEOUS | 1 refills | Status: DC
Start: 2023-04-25 — End: 2023-06-14
  Filled 2023-04-25 – 2023-05-05 (×2): qty 3, 28d supply, fill #0
  Filled 2023-06-02: qty 3, 28d supply, fill #1

## 2023-04-25 NOTE — Assessment & Plan Note (Signed)
Improving Reviewed lab from last visit He was taking vitamin D 4,000 international units  daily Vitamin D level improved from 34.3--> 69.3.  Reduce OTC vitamin D to 2,000 international units  daily Repeat lab in 6 mos

## 2023-04-25 NOTE — Progress Notes (Signed)
Office: [phone]  /  Fax: [phone]  WEIGHT SUMMARY AND BIOMETRICS  Starting Date: 03/14/22  Starting Weight: 246lb   Weight Lost Since Last Visit: 2lb   Vitals Temp: 97.7 F (36.5 C) BP: 115/81 Pulse Rate: 88 SpO2: 98 %   Body Composition  Body Fat %: 30.1 % Fat Mass (lbs): 64 lbs Muscle Mass (lbs): 141.4 lbs Total Body Water (lbs): 106 lbs Visceral Fat Rating : 14    HPI  Chief Complaint: OBESITY  Stephen Jenkins is here to discuss his progress with his obesity treatment plan. He is on the the Category 3 Plan and states he is following his eating plan approximately 75 % of the time. He states he has been more active since his last visit.    Interval History:  Since last office visit he is down 2 lb He is down 1.6 lb of muscle mass and is up 0.2 lb of body fat He would like to get down to 190 lb He is working a lot and is still working on his basement He has a net weight loss of 34 lb in the past 13 mos This is a 13.8% TBW loss since starting our program  He is playing some golf Back pain has improved Hunger and cravings well controlled on Wegovy Time is the limiting factor with adding in regular exercise  Pharmacotherapy: Wegovy 1.7 mg weekly  PHYSICAL EXAM:  Blood pressure 115/81, pulse 88, temperature 97.7 F (36.5 C), height 5\' 8"  (1.727 m), weight 212 lb (96.2 kg), SpO2 98%. Body mass index is 32.23 kg/m.  General: He is healthy appearing, cooperative, alert, well developed, and in no acute distress. PSYCH: Has normal mood, affect and thought process.   Lungs: Normal breathing effort, no conversational dyspnea.   ASSESSMENT AND PLAN  TREATMENT PLAN FOR OBESITY:  Recommended Dietary Goals  Stephen Jenkins is currently in the action stage of change. As such, his goal is to continue weight management plan. He has agreed to practicing portion control and making smarter food choices, such as increasing vegetables and decreasing simple  carbohydrates.  Behavioral Intervention  We discussed the following Behavioral Modification Strategies today: increasing lean protein intake to established goals, decreasing simple carbohydrates , increasing fiber rich foods, work on meal planning and preparation, keeping healthy foods at home, planning for success, better snacking choices, and continue to work on maintaining a reduced calorie state, getting the recommended amount of protein, incorporating whole foods, making healthy choices, staying well hydrated and practicing mindfulness when eating..  Additional resources provided today: NA  Recommended Physical Activity Goals  Stephen Jenkins has been advised to work up to 150 minutes of moderate intensity aerobic activity a week and strengthening exercises 2-3 times per week for cardiovascular health, weight loss maintenance and preservation of muscle mass.   He has agreed to Think about enjoyable ways to increase daily physical activity and overcoming barriers to exercise  Pharmacotherapy changes for the treatment of obesity: none  ASSOCIATED CONDITIONS ADDRESSED TODAY  Polyphagia Assessment & Plan: Well controlled on Wegovy 1.7 mg weekly injection without adverse SE Denies meal skipping, nausea, heartburn or constipation Prioritizing lean protein with meals Working on increasing intake of fruits, veggies and water intake  Continue Wegovy 1.7 mg weekly  Orders: -     GNFAOZ; Inject 1.7 mg into the skin once a week.  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  Reduce OTC vitamin D to 2,000 international units  daily Repeat lab in 6 mos       He was informed of  the importance of frequent follow up visits to maximize his success with intensive lifestyle modifications for his multiple health conditions.   ATTESTASTION STATEMENTS:  Reviewed by clinician on day of visit: allergies, medications, problem list, medical history, surgical history, family history, social history, and previous encounter notes pertinent to obesity diagnosis.   I have personally spent 30 minutes total time today in preparation, patient care, nutritional counseling and documentation for this visit, including the following: review of clinical lab tests; review of medical tests/procedures/services.      [name], DO DABFM, DABOM [facility] [address] [place], [place] [address] [phone]

## 2023-04-25 NOTE — Assessment & Plan Note (Signed)
Well controlled on Wegovy 1.7 mg weekly injection without adverse SE Denies meal skipping, nausea, heartburn or constipation Prioritizing lean protein with meals Working on increasing intake of fruits, veggies and water intake  Continue Wegovy 1.7 mg weekly

## 2023-05-05 ENCOUNTER — Other Ambulatory Visit (HOSPITAL_BASED_OUTPATIENT_CLINIC_OR_DEPARTMENT_OTHER): Payer: Self-pay

## 2023-05-06 ENCOUNTER — Other Ambulatory Visit (HOSPITAL_BASED_OUTPATIENT_CLINIC_OR_DEPARTMENT_OTHER): Payer: Self-pay

## 2023-06-02 ENCOUNTER — Other Ambulatory Visit (HOSPITAL_BASED_OUTPATIENT_CLINIC_OR_DEPARTMENT_OTHER): Payer: Self-pay

## 2023-06-14 ENCOUNTER — Ambulatory Visit: Payer: BC Managed Care – PPO | Admitting: Family Medicine

## 2023-06-14 ENCOUNTER — Encounter: Payer: Self-pay | Admitting: Family Medicine

## 2023-06-14 ENCOUNTER — Other Ambulatory Visit (HOSPITAL_BASED_OUTPATIENT_CLINIC_OR_DEPARTMENT_OTHER): Payer: Self-pay

## 2023-06-14 VITALS — BP 113/78 | HR 81 | Temp 98.0°F | Ht 68.0 in | Wt 217.0 lb

## 2023-06-14 DIAGNOSIS — E559 Vitamin D deficiency, unspecified: Secondary | ICD-10-CM

## 2023-06-14 DIAGNOSIS — E66811 Obesity, class 1: Secondary | ICD-10-CM

## 2023-06-14 DIAGNOSIS — Z6832 Body mass index (BMI) 32.0-32.9, adult: Secondary | ICD-10-CM | POA: Diagnosis not present

## 2023-06-14 DIAGNOSIS — E6609 Other obesity due to excess calories: Secondary | ICD-10-CM | POA: Insufficient documentation

## 2023-06-14 MED ORDER — WEGOVY 1.7 MG/0.75ML ~~LOC~~ SOAJ
1.7000 mg | SUBCUTANEOUS | 1 refills | Status: DC
Start: 2023-06-14 — End: 2023-07-12
  Filled 2023-06-14 – 2023-07-03 (×7): qty 3, 28d supply, fill #0

## 2023-06-14 NOTE — Assessment & Plan Note (Signed)
 Last vitamin D  Lab Results  Component Value Date   VD25OH 69.3 03/16/2023   Doing well on OTC vitamin D  2,000 international units  daily for maintenance Levels improved after using several months of high dose vitamin D  Energy levels are stable  Repeat lab in 2 mos

## 2023-06-14 NOTE — Progress Notes (Signed)
 Office: [phone]  /  Fax: [phone]  WEIGHT SUMMARY AND BIOMETRICS  Starting Date: 03/14/22  Starting Weight: 246lb   Weight Lost Since Last Visit: 0lb   Vitals Temp: 98 F (36.7 C) BP: 113/78 Pulse Rate: 81 SpO2: 96 %   Body Composition  Body Fat %: 29.9 % Fat Mass (lbs): 64.8 lbs Muscle Mass (lbs): 144.6 lbs Total Body Water (lbs): 107 lbs Visceral Fat Rating : 14    HPI  Chief Complaint: OBESITY  Stephen Jenkins is here to discuss his progress with his obesity treatment plan. He is on the the Category 3 Plan and states he is following his eating plan approximately 20 % of the time. He states he is exercising 0 minutes 0 times per week.  Interval History:  Since last office visit he is up 5 lb He is up 3.2 lb of muscle mass and up 0.8 lb of body fat since last visit This gives him a net weight loss of 29 lb in the past 15 mos of medically supervised weight management This is an 11.7% TBW loss He did indulge more on sweets around the holidays He has been busy with work He is somewhat active but has not added in exercise He has had occasional GI upset from tomatoes on the night he does his Wegovy  injection  Pharmacotherapy: Wegovy  1.7 mg weekly  PHYSICAL EXAM:  Blood pressure 113/78, pulse 81, temperature 98 F (36.7 C), height 5' 8 (1.727 m), weight 217 lb (98.4 kg), SpO2 96%. Body mass index is 32.99 kg/m.  General: He is overweight, cooperative, alert, well developed, and in no acute distress. PSYCH: Has normal mood, affect and thought process.   Lungs: Normal breathing effort, no conversational dyspnea.   ASSESSMENT AND PLAN  TREATMENT PLAN FOR OBESITY:  Recommended Dietary Goals  Jt is currently in the action stage of change. As such, his goal is to continue weight management plan. He has agreed to practicing portion control and making smarter food choices, such as increasing vegetables and decreasing simple carbohydrates.  Behavioral  Intervention  We discussed the following Behavioral Modification Strategies today: increasing lean protein intake to established goals, increasing vegetables, increasing fiber rich foods, avoiding skipping meals, increasing water intake , keeping healthy foods at home, work on managing stress, creating time for self-care and relaxation, planning for success, and continue to work on maintaining a reduced calorie state, getting the recommended amount of protein, incorporating whole foods, making healthy choices, staying well hydrated and practicing mindfulness when eating..  Additional resources provided today: NA  Recommended Physical Activity Goals  Stephen Jenkins has been advised to work up to 150 minutes of moderate intensity aerobic activity a week and strengthening exercises 2-3 times per week for cardiovascular health, weight loss maintenance and preservation of muscle mass.   He has agreed to Think about enjoyable ways to increase daily physical activity and overcoming barriers to exercise and Increase physical activity in their day and reduce sedentary time (increase NEAT).  Pharmacotherapy changes for the treatment of obesity: none  ASSOCIATED CONDITIONS ADDRESSED TODAY  Vitamin D  deficiency Assessment & Plan: Last vitamin D  Lab Results  Component Value Date   VD25OH 69.3 03/16/2023   Doing well on OTC vitamin D  2,000 international units  daily for maintenance Levels improved after using several months of high dose vitamin D  Energy levels are stable  Repeat lab in 2 mos   Class 1 obesity due to excess calories with body mass index (BMI) of 32.0 to  32.9 in adult, unspecified whether serious comorbidity present Assessment & Plan: Overall, he has lost 11.7% TBW in the past 15 mos with a reduced cal higher protein diet + use of Wegovy  which he has tolerated dose increases rather well.  Barriers to further progress include working 2 jobs, limited time, lack of formal exercise.  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  Dispense: 3 mL; Refill: 1      He was informed of the importance of frequent follow up visits to maximize his success with intensive lifestyle modifications for his multiple health conditions.   ATTESTASTION STATEMENTS:  Reviewed by clinician on day of visit: allergies, medications, problem list, medical history, surgical history, family history, social history, and previous encounter notes pertinent to obesity diagnosis.   I have personally spent 30 minutes total time today in preparation, patient care, nutritional counseling and documentation for this visit, including the following: review of clinical lab tests; review of medical tests/procedures/services.      Stephen FORBES Haddock, DO DABFM, DABOM [facility] [address] [place], [place] [address] [phone]

## 2023-06-14 NOTE — Assessment & Plan Note (Signed)
 Overall, he has lost 11.7% TBW in the past 15 mos with a reduced cal higher protein diet + use of Wegovy  which he has tolerated dose increases rather well.  Barriers to further progress include working 2 jobs, limited time, lack of formal exercise.  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

## 2023-06-22 ENCOUNTER — Other Ambulatory Visit (HOSPITAL_BASED_OUTPATIENT_CLINIC_OR_DEPARTMENT_OTHER): Payer: Self-pay

## 2023-06-23 ENCOUNTER — Other Ambulatory Visit (HOSPITAL_BASED_OUTPATIENT_CLINIC_OR_DEPARTMENT_OTHER): Payer: Self-pay

## 2023-06-24 ENCOUNTER — Other Ambulatory Visit (HOSPITAL_BASED_OUTPATIENT_CLINIC_OR_DEPARTMENT_OTHER): Payer: Self-pay

## 2023-06-26 ENCOUNTER — Other Ambulatory Visit (HOSPITAL_BASED_OUTPATIENT_CLINIC_OR_DEPARTMENT_OTHER): Payer: Self-pay

## 2023-06-29 ENCOUNTER — Other Ambulatory Visit (HOSPITAL_BASED_OUTPATIENT_CLINIC_OR_DEPARTMENT_OTHER): Payer: Self-pay

## 2023-07-03 ENCOUNTER — Other Ambulatory Visit (HOSPITAL_BASED_OUTPATIENT_CLINIC_OR_DEPARTMENT_OTHER): Payer: Self-pay

## 2023-07-06 DIAGNOSIS — R509 Fever, unspecified: Secondary | ICD-10-CM | POA: Diagnosis not present

## 2023-07-06 DIAGNOSIS — R051 Acute cough: Secondary | ICD-10-CM | POA: Diagnosis not present

## 2023-07-06 DIAGNOSIS — Z03818 Encounter for observation for suspected exposure to other biological agents ruled out: Secondary | ICD-10-CM | POA: Diagnosis not present

## 2023-07-06 DIAGNOSIS — J101 Influenza due to other identified influenza virus with other respiratory manifestations: Secondary | ICD-10-CM | POA: Diagnosis not present

## 2023-07-12 ENCOUNTER — Other Ambulatory Visit (HOSPITAL_BASED_OUTPATIENT_CLINIC_OR_DEPARTMENT_OTHER): Payer: Self-pay

## 2023-07-12 ENCOUNTER — Encounter: Payer: Self-pay | Admitting: Family Medicine

## 2023-07-12 ENCOUNTER — Ambulatory Visit: Payer: BC Managed Care – PPO | Admitting: Family Medicine

## 2023-07-12 VITALS — BP 112/78 | HR 85 | Temp 98.1°F | Ht 68.0 in | Wt 213.0 lb

## 2023-07-12 DIAGNOSIS — R632 Polyphagia: Secondary | ICD-10-CM | POA: Diagnosis not present

## 2023-07-12 DIAGNOSIS — R110 Nausea: Secondary | ICD-10-CM | POA: Insufficient documentation

## 2023-07-12 DIAGNOSIS — E66811 Obesity, class 1: Secondary | ICD-10-CM | POA: Diagnosis not present

## 2023-07-12 DIAGNOSIS — E6609 Other obesity due to excess calories: Secondary | ICD-10-CM

## 2023-07-12 DIAGNOSIS — Z6832 Body mass index (BMI) 32.0-32.9, adult: Secondary | ICD-10-CM

## 2023-07-12 MED ORDER — WEGOVY 1.7 MG/0.75ML ~~LOC~~ SOAJ
1.7000 mg | SUBCUTANEOUS | 1 refills | Status: DC
Start: 2023-07-12 — End: 2023-08-29
  Filled 2023-07-12: qty 3, 28d supply, fill #0

## 2023-07-12 MED ORDER — ONDANSETRON 8 MG PO TBDP: 8.0000 mg | ORAL_TABLET | Freq: Three times a day (TID) | ORAL | 0 refills | Status: DC | PRN

## 2023-07-12 NOTE — Assessment & Plan Note (Signed)
 Improved appetite control on Wegovy  1.7 mg once weekly injection with rare meal skipping.  He has made more of an effort to incorporate lean protein with meals and snacks He has been counseled on increasing his fruit intake to 2 servings per day allowing plenty of nonstarchy vegetables Encouraged hydration with 100 ounces of water daily

## 2023-07-12 NOTE — Assessment & Plan Note (Signed)
 He has had a rare occasion of nausea following his Wegovy  injection, usually occurring within 24 hours postinjection.  This last episode of nausea with after having a 2-week gap without his Wegovy .  He denies epigastric pain or vomiting.  He has occasional heartburn, using Tums as needed.  Prescribe Zofran  8 mg ODT for as needed use Continue to work on hydrating well with water, small portion sizes, avoiding overeating and late-night meals

## 2023-07-12 NOTE — Progress Notes (Signed)
 Office: [phone]  /  Fax: [phone]  WEIGHT SUMMARY AND BIOMETRICS  Starting Date: 03/14/22  Starting Weight: 246lb   Weight Lost Since Last Visit: 4lb   Vitals Temp: 98.1 F (36.7 C) BP: 112/78 Pulse Rate: 85 SpO2: 98 %   Body Composition  Body Fat %: 29.5 % Fat Mass (lbs): 63 lbs Muscle Mass (lbs): 143.4 lbs Total Body Water (lbs): 105.8 lbs Visceral Fat Rating : 14     HPI  Chief Complaint: OBESITY  Stephen Jenkins is here to discuss his progress with his obesity treatment plan. He is on the the Category 3 Plan and states he is following his eating plan approximately 70 % of the time. He states he is exercising 0 minutes 0 times per week.   Interval History:  Since last office visit he is down 4 lb  This gives him a net weight loss of 33 lb in the past 16 mos This is a 13.4% total body weight loss He had a gap without Wegovy  x 2 weeks He had some nausea when resuming Wegovy  at 1.7 mg weekly He has adequate satiety on Wegovy  1.7 mg weekly He is mindful of food choices He would like to get to 200 lb He has not yet added in formal exercise but he remains physically active doing projects around the house Time is still a barrier to meal planning and exercise as he is working 2 jobs  Pharmacotherapy: Wegovy  1.7 mg weekly  PHYSICAL EXAM:  Blood pressure 112/78, pulse 85, temperature 98.1 F (36.7 C), height 5' 8 (1.727 m), weight 213 lb (96.6 kg), SpO2 98%. Body mass index is 32.39 kg/m.  General: He is overweight, cooperative, alert, well developed, and in no acute distress. PSYCH: Has normal mood, affect and thought process.   Lungs: Normal breathing effort, no conversational dyspnea.   ASSESSMENT AND PLAN  TREATMENT PLAN FOR OBESITY:  Recommended Dietary Goals  Stephen Jenkins is currently in the action stage of change. As such, his goal is to continue weight management plan. He has agreed to practicing portion control and making smarter food choices,  such as increasing vegetables and decreasing simple carbohydrates.  Behavioral Intervention  We discussed the following Behavioral Modification Strategies today: increasing lean protein intake to established goals, increasing fiber rich foods, avoiding skipping meals, increasing water intake , work on meal planning and preparation, practice mindfulness eating and understand the difference between hunger signals and cravings, work on managing stress, creating time for self-care and relaxation, avoiding temptations and identifying enticing environmental cues, and continue to work on maintaining a reduced calorie state, getting the recommended amount of protein, incorporating whole foods, making healthy choices, staying well hydrated and practicing mindfulness when eating..  Additional resources provided today: NA  Recommended Physical Activity Goals  Stephen Jenkins has been advised to work up to 150 minutes of moderate intensity aerobic activity a week and strengthening exercises 2-3 times per week for cardiovascular health, weight loss maintenance and preservation of muscle mass.   He has agreed to Think about enjoyable ways to increase daily physical activity and overcoming barriers to exercise and Increase physical activity in their day and reduce sedentary time (increase NEAT).  Pharmacotherapy changes for the treatment of obesity: None  ASSOCIATED CONDITIONS ADDRESSED TODAY  Polyphagia Assessment & Plan: Improved appetite control on Wegovy  1.7 mg once weekly injection with rare meal skipping.  He has made more of an effort to incorporate lean protein with meals and snacks He has been counseled on increasing  his fruit intake to 2 servings per day allowing plenty of nonstarchy vegetables Encouraged hydration with 100 ounces of water daily   Class 1 obesity due to excess calories with body mass index (BMI) of 32.0 to 32.9 in adult, unspecified whether serious comorbidity present -     Wegovy ;  Inject 1.7 mg into the skin once a week.  Dispense: 3 mL; Refill: 1  Nausea Assessment & Plan: He has had a rare occasion of nausea following his Wegovy  injection, usually occurring within 24 hours postinjection.  This last episode of nausea with after having a 2-week gap without his Wegovy .  He denies epigastric pain or vomiting.  He has occasional heartburn, using Tums as needed.  Prescribe Zofran  8 mg ODT for as needed use Continue to work on hydrating well with water, small portion sizes, avoiding overeating and late-night meals  Orders: -     Ondansetron ; Take 1 tablet (8 mg total) by mouth every 8 (eight) hours as needed for nausea or vomiting.  Dispense: 20 tablet; Refill: 0      He was informed of the importance of frequent follow up visits to maximize his success with intensive lifestyle modifications for his multiple health conditions.   ATTESTASTION STATEMENTS:  Reviewed by clinician on day of visit: allergies, medications, problem list, medical history, surgical history, family history, social history, and previous encounter notes pertinent to obesity diagnosis.   I have personally spent 30 minutes total time today in preparation, patient care, nutritional counseling and documentation for this visit, including the following: review of clinical lab tests; review of medical tests/procedures/services.      Stephen FORBES Haddock, DO DABFM, DABOM [facility] [address] [place], [place] [address] [phone]

## 2023-08-23 ENCOUNTER — Ambulatory Visit: Payer: BC Managed Care – PPO | Admitting: Family Medicine

## 2023-08-29 ENCOUNTER — Ambulatory Visit: Admitting: Family Medicine

## 2023-08-29 ENCOUNTER — Other Ambulatory Visit (HOSPITAL_BASED_OUTPATIENT_CLINIC_OR_DEPARTMENT_OTHER): Payer: Self-pay

## 2023-08-29 ENCOUNTER — Encounter: Payer: Self-pay | Admitting: Family Medicine

## 2023-08-29 VITALS — BP 106/73 | HR 68 | Temp 98.0°F | Ht 68.0 in | Wt 219.0 lb

## 2023-08-29 DIAGNOSIS — Z6832 Body mass index (BMI) 32.0-32.9, adult: Secondary | ICD-10-CM

## 2023-08-29 DIAGNOSIS — R110 Nausea: Secondary | ICD-10-CM | POA: Diagnosis not present

## 2023-08-29 DIAGNOSIS — R632 Polyphagia: Secondary | ICD-10-CM

## 2023-08-29 DIAGNOSIS — E66811 Obesity, class 1: Secondary | ICD-10-CM | POA: Diagnosis not present

## 2023-08-29 DIAGNOSIS — E6609 Other obesity due to excess calories: Secondary | ICD-10-CM

## 2023-08-29 MED ORDER — ONDANSETRON 8 MG PO TBDP
8.0000 mg | ORAL_TABLET | Freq: Three times a day (TID) | ORAL | 0 refills | Status: AC | PRN
  Filled 2023-08-29: qty 20, 7d supply, fill #0

## 2023-08-29 MED ORDER — WEGOVY 1.7 MG/0.75ML ~~LOC~~ SOAJ
1.7000 mg | SUBCUTANEOUS | 1 refills | Status: DC
  Filled 2023-08-29: qty 3, 28d supply, fill #0
  Filled 2023-09-25: qty 3, 28d supply, fill #1

## 2023-08-29 NOTE — Progress Notes (Signed)
 Office: [phone]  /  Fax: [phone]  WEIGHT SUMMARY AND BIOMETRICS  Starting Date: 03/14/22  Starting Weight: 246lb   Weight Lost Since Last Visit: 0lb   Vitals Temp: 98 F (36.7 C) BP: 106/73 Pulse Rate: 68 SpO2: 98 %   Body Composition  Body Fat %: 29.6 % Fat Mass (lbs): 64.8 lbs Muscle Mass (lbs): 146.8 lbs Total Body Water (lbs): 107.6 lbs Visceral Fat Rating : 14    HPI  Chief Complaint: OBESITY  Stephen Jenkins is here to discuss his progress with his obesity treatment plan. He is on the the Category 3 Plan and states he is following his eating plan approximately 45 % of the time. He states he is working around the house.   Interval History:  Since last office visit he is up 6 lb  He is up 3.4 lb of muscle and down 1.8 lb of body fat since last visit This gives him a net weight loss of 27 lb in 17 mos of medically supervised weight management He did skip 2 injections of Wegovy He has been forgetting to take it due to being so busy with work He has had to travel and had vacation making poorer food choices and eating a bit more carbs He has been doing some yardwork He has previously had 1-2 days of nausea post injection but has not started Zofran yet  Pharmacotherapy: Wegovy 1.7 mg weekly injection  PHYSICAL EXAM:  Blood pressure 106/73, pulse 68, temperature 98 F (36.7 C), height 5\' 8"  (1.727 m), weight 219 lb (99.3 kg), SpO2 98%. Body mass index is 33.3 kg/m.  General: He is healthy appearing, cooperative, alert, well developed, and in no acute distress. PSYCH: Has normal mood, affect and thought process.   Lungs: Normal breathing effort, no conversational dyspnea.   ASSESSMENT AND PLAN  TREATMENT PLAN FOR OBESITY:  Recommended Dietary Goals  Stephen Jenkins is currently in the action stage of change. As such, his goal is to continue weight management plan. He has agreed to practicing portion control and making smarter food choices, such as increasing  vegetables and decreasing simple carbohydrates.  Behavioral Intervention  We discussed the following Behavioral Modification Strategies today: increasing lean protein intake to established goals, increasing water intake , avoiding temptations and identifying enticing environmental cues, continue to practice mindfulness when eating, planning for success, better snacking choices, and continue to work on maintaining a reduced calorie state, getting the recommended amount of protein, incorporating whole foods, making healthy choices, staying well hydrated and practicing mindfulness when eating..  Additional resources provided today: NA  Recommended Physical Activity Goals  Stephen Jenkins has been advised to work up to 150 minutes of moderate intensity aerobic activity a week and strengthening exercises 2-3 times per week for cardiovascular health, weight loss maintenance and preservation of muscle mass.   He has agreed to Increase the intensity, frequency or duration of strengthening exercises  and Increase the intensity, frequency or duration of aerobic exercises    Pharmacotherapy changes for the treatment of obesity: none  ASSOCIATED CONDITIONS ADDRESSED TODAY  Polyphagia Improved on Wegovy with adequate satiety at 1.7 mg weekly dose Hunger and cravings did worsen without Wegovy  Resume Wegovy 1.7 mg weekly Work on increased lean protein and fiber intake while reducing breads and sweets  Class 1 obesity due to excess calories with body mass index (BMI) of 32.0 to 32.9 in adult, unspecified whether serious comorbidity present -     GNFAOZ; Inject 1.7 mg into the skin  once a week.  Dispense: 3 mL; Refill: 1  Nausea -     Ondansetron; Take 1 tablet (8 mg total) by mouth every 8 (eight) hours as needed for nausea or vomiting.  Dispense: 20 tablet; Refill: 0      He was informed of the importance of frequent follow up visits to maximize his success with intensive lifestyle modifications for his  multiple health conditions.   ATTESTASTION STATEMENTS:  Reviewed by clinician on day of visit: allergies, medications, problem list, medical history, surgical history, family history, social history, and previous encounter notes pertinent to obesity diagnosis.   I have personally spent 30 minutes total time today in preparation, patient care, nutritional counseling and documentation for this visit, including the following: review of clinical lab tests; review of medical tests/procedures/services.      Stephen Brink, DO DABFM, DABOM [facility] [address] [place], [place] [address] [phone]

## 2023-09-01 DIAGNOSIS — Z0000 Encounter for general adult medical examination without abnormal findings: Secondary | ICD-10-CM | POA: Diagnosis not present

## 2023-09-06 DIAGNOSIS — Z0000 Encounter for general adult medical examination without abnormal findings: Secondary | ICD-10-CM | POA: Diagnosis not present

## 2023-10-04 ENCOUNTER — Encounter: Payer: Self-pay | Admitting: Family Medicine

## 2023-10-04 ENCOUNTER — Ambulatory Visit: Admitting: Family Medicine

## 2023-10-04 ENCOUNTER — Other Ambulatory Visit (HOSPITAL_BASED_OUTPATIENT_CLINIC_OR_DEPARTMENT_OTHER): Payer: Self-pay

## 2023-10-04 VITALS — BP 115/82 | HR 84 | Temp 98.6°F | Ht 68.0 in | Wt 216.0 lb

## 2023-10-04 DIAGNOSIS — E66811 Obesity, class 1: Secondary | ICD-10-CM

## 2023-10-04 DIAGNOSIS — Z6832 Body mass index (BMI) 32.0-32.9, adult: Secondary | ICD-10-CM

## 2023-10-04 DIAGNOSIS — E6609 Other obesity due to excess calories: Secondary | ICD-10-CM

## 2023-10-04 DIAGNOSIS — R632 Polyphagia: Secondary | ICD-10-CM

## 2023-10-04 MED ORDER — WEGOVY 1.7 MG/0.75ML ~~LOC~~ SOAJ
1.7000 mg | SUBCUTANEOUS | 1 refills | Status: AC
  Filled 2023-10-04: qty 3, 28d supply, fill #0

## 2023-10-04 NOTE — Progress Notes (Signed)
 Office: [phone]  /  Fax: [phone]  WEIGHT SUMMARY AND BIOMETRICS  Starting Date: 03/14/22  Starting Weight: 246lb   Weight Lost Since Last Visit: 3lb   Vitals Temp: 98.6 F (37 C) BP: 115/82 Pulse Rate: 84 SpO2: 99 %   Body Composition  Body Fat %: 28.8 % Fat Mass (lbs): 62.4 lbs Muscle Mass (lbs): 146.6 lbs Total Body Water (lbs): 110.2 lbs Visceral Fat Rating : 14    HPI  Chief Complaint: OBESITY  Stephen Jenkins is here to discuss his progress with his obesity treatment plan. He is on the the Category 3 Plan and states he is following his eating plan approximately 60-65 % of the time. He states he is doing lots of yard work.   Interval History:  Since last office visit he is down 3 lb He is down 2.4 lb of body fat since last visit and down 0.2 lb of muscle mass He has been trying to hydrate better with some occasional muscle cramps, using a sugar free electrolyte drink He has been working in the yard and has been walking more Sleep has been lacking and stress has been higher He now has a net weight loss of 30 lb  in 18 mos This is a 12.1% total body weight loss He has had some changes with his primary job but has a good support system He denies nausea, constipation, meal skipping or heartburn  Pharmacotherapy: Wegovy  1.7 mg weekly  PHYSICAL EXAM:  Blood pressure 115/82, pulse 84, temperature 98.6 F (37 C), height 5\' 8"  (1.727 m), weight 216 lb (98 kg), SpO2 99%. Body mass index is 32.84 kg/m.  General: He is healthy appearing,  cooperative, alert, well developed, and in no acute distress. PSYCH: Has normal mood, affect and thought process.   Lungs: Normal breathing effort, no conversational dyspnea.  ASSESSMENT AND PLAN  TREATMENT PLAN FOR OBESITY:  Recommended Dietary Goals  Stephen Jenkins is currently in the action stage of change. As such, his goal is to continue weight management plan. He has agreed to portion control, smarter choices  approximately 1800 cal/day which should include 100 to 120 g protein daily  Behavioral Intervention  We discussed the following Behavioral Modification Strategies today: increasing lean protein intake to established goals, increasing water intake , work on meal planning and preparation, keeping healthy foods at home, practice mindfulness eating and understand the difference between hunger signals and cravings, work on managing stress, creating time for self-care and relaxation, avoiding temptations and identifying enticing environmental cues, planning for success, and continue to work on maintaining a reduced calorie state, getting the recommended amount of protein, incorporating whole foods, making healthy choices, staying well hydrated and practicing mindfulness when eating..  Additional resources provided today: NA  Recommended Physical Activity Goals  Stephen Jenkins has been advised to work up to 150 minutes of moderate intensity aerobic activity a week and strengthening exercises 2-3 times per week for cardiovascular health, weight loss maintenance and preservation of muscle mass.   He has agreed to Think about enjoyable ways to increase daily physical activity and overcoming barriers to exercise and Increase physical activity in their day and reduce sedentary time (increase NEAT). Barriers to increasing exercise include lack of, lack of sleep  Pharmacotherapy changes for the treatment of obesity: None  ASSOCIATED CONDITIONS ADDRESSED TODAY  Polyphagia Improving on Wegovy  1.7 mg once weekly injection without meal skipping or GI intolerance.  He has also been focusing more on reducing refined carbohydrate intake and getting lean  protein and fiber with meals.  He has decreased his intake of water and sugar-free beverages.  Class 1 obesity due to excess calories with body mass index (BMI) of 32.0 to 32.9 in adult, unspecified whether serious comorbidity present -     Wegovy ; Inject 1.7 mg into the  skin once a week.  Dispense: 3 mL; Refill: 1 Benefit outweighs the risk to continue Wegovy  at 1.7 mg once weekly injection.  He has adequate satiety assistance with meal skipping or GI intolerance.  He has lost 12.1% of his total body weight in the past 18 months of medically supervised weight management.  He plans to increase his activity as time allows.  Recommend adding in a sugar-free electrolyte drink daily for the occasional myalgias.     He was informed of the importance of frequent follow up visits to maximize his success with intensive lifestyle modifications for his multiple health conditions.   ATTESTASTION STATEMENTS:  Reviewed by clinician on day of visit: allergies, medications, problem list, medical history, surgical history, family history, social history, and previous encounter notes pertinent to obesity diagnosis.   I have personally spent 20 minutes total time today in preparation, patient care, nutritional counseling and education,  and documentation for this visit, including the following: review of most recent clinical lab tests, prescribing medications/ refilling medications, reviewing medical assistant documentation, review and interpretation of bioimpedence results.     Stephen Jenkins, D.O. DABFM, DABOM [facility] [address] [place], [place] [address] [phone]

## 2023-11-08 ENCOUNTER — Ambulatory Visit: Admitting: Family Medicine

## 2024-02-02 DIAGNOSIS — L308 Other specified dermatitis: Secondary | ICD-10-CM | POA: Diagnosis not present

## 2024-02-02 DIAGNOSIS — L081 Erythrasma: Secondary | ICD-10-CM | POA: Diagnosis not present

## 2024-02-02 DIAGNOSIS — F411 Generalized anxiety disorder: Secondary | ICD-10-CM | POA: Diagnosis not present

## 2024-02-06 DIAGNOSIS — J069 Acute upper respiratory infection, unspecified: Secondary | ICD-10-CM | POA: Diagnosis not present

## 2024-02-06 DIAGNOSIS — F320 Major depressive disorder, single episode, mild: Secondary | ICD-10-CM | POA: Diagnosis not present

## 2024-02-06 DIAGNOSIS — F411 Generalized anxiety disorder: Secondary | ICD-10-CM | POA: Diagnosis not present

## 2024-03-05 DIAGNOSIS — G4733 Obstructive sleep apnea (adult) (pediatric): Secondary | ICD-10-CM | POA: Diagnosis not present

## 2024-03-05 DIAGNOSIS — F320 Major depressive disorder, single episode, mild: Secondary | ICD-10-CM | POA: Diagnosis not present

## 2024-03-05 DIAGNOSIS — G479 Sleep disorder, unspecified: Secondary | ICD-10-CM | POA: Diagnosis not present

## 2024-03-05 DIAGNOSIS — F411 Generalized anxiety disorder: Secondary | ICD-10-CM | POA: Diagnosis not present
